# Patient Record
Sex: Female | Born: 1985 | Race: White | Hispanic: No | Smoking: Current every day smoker
Health system: Southern US, Community
[De-identification: ages and names within clinical notes are randomized; demographics above are authoritative.]

## PROBLEM LIST (undated history)

## (undated) ENCOUNTER — Ambulatory Visit: Admission: EM | Payer: BC Managed Care – PPO

## (undated) DIAGNOSIS — M51369 Other intervertebral disc degeneration, lumbar region without mention of lumbar back pain or lower extremity pain: Secondary | ICD-10-CM

## (undated) DIAGNOSIS — F32A Depression, unspecified: Secondary | ICD-10-CM

## (undated) DIAGNOSIS — F329 Major depressive disorder, single episode, unspecified: Secondary | ICD-10-CM

## (undated) DIAGNOSIS — S2232XA Fracture of one rib, left side, initial encounter for closed fracture: Secondary | ICD-10-CM

## (undated) DIAGNOSIS — J45909 Unspecified asthma, uncomplicated: Secondary | ICD-10-CM

## (undated) DIAGNOSIS — M5126 Other intervertebral disc displacement, lumbar region: Secondary | ICD-10-CM

## (undated) DIAGNOSIS — J942 Hemothorax: Secondary | ICD-10-CM

## (undated) DIAGNOSIS — S32302A Unspecified fracture of left ilium, initial encounter for closed fracture: Secondary | ICD-10-CM

## (undated) DIAGNOSIS — S060X9A Concussion with loss of consciousness of unspecified duration, initial encounter: Secondary | ICD-10-CM

## (undated) DIAGNOSIS — I2699 Other pulmonary embolism without acute cor pulmonale: Secondary | ICD-10-CM

## (undated) DIAGNOSIS — F419 Anxiety disorder, unspecified: Secondary | ICD-10-CM

## (undated) DIAGNOSIS — Z720 Tobacco use: Secondary | ICD-10-CM

## (undated) DIAGNOSIS — G4700 Insomnia, unspecified: Secondary | ICD-10-CM

## (undated) DIAGNOSIS — M5136 Other intervertebral disc degeneration, lumbar region: Secondary | ICD-10-CM

## (undated) HISTORY — PX: COLPOSCOPY: SHX161

## (undated) HISTORY — PX: CHEST TUBE INSERTION: SHX231

---

## 2008-02-11 ENCOUNTER — Encounter (INDEPENDENT_AMBULATORY_CARE_PROVIDER_SITE_OTHER): Payer: Self-pay | Admitting: Unknown Physician Specialty

## 2008-02-11 ENCOUNTER — Other Ambulatory Visit: Admission: RE | Admit: 2008-02-11 | Discharge: 2008-02-11 | Payer: Self-pay | Admitting: Unknown Physician Specialty

## 2009-02-03 ENCOUNTER — Emergency Department (HOSPITAL_COMMUNITY): Admission: EM | Admit: 2009-02-03 | Discharge: 2009-02-03 | Payer: Self-pay | Admitting: Emergency Medicine

## 2010-04-10 LAB — CULTURE, ROUTINE-ABSCESS

## 2011-07-15 ENCOUNTER — Emergency Department (HOSPITAL_COMMUNITY)
Admission: EM | Admit: 2011-07-15 | Discharge: 2011-07-15 | Disposition: A | Discharge status: Home / Self Care | Payer: Self-pay | Attending: Emergency Medicine | Admitting: Emergency Medicine

## 2011-07-15 ENCOUNTER — Encounter (HOSPITAL_COMMUNITY): Payer: Self-pay | Admitting: *Deleted

## 2011-07-15 ENCOUNTER — Emergency Department (HOSPITAL_COMMUNITY): Payer: Self-pay

## 2011-07-15 DIAGNOSIS — S61209A Unspecified open wound of unspecified finger without damage to nail, initial encounter: Secondary | ICD-10-CM | POA: Insufficient documentation

## 2011-07-15 DIAGNOSIS — W268XXA Contact with other sharp object(s), not elsewhere classified, initial encounter: Secondary | ICD-10-CM | POA: Insufficient documentation

## 2011-07-15 DIAGNOSIS — S61411A Laceration without foreign body of right hand, initial encounter: Secondary | ICD-10-CM

## 2011-07-15 DIAGNOSIS — F17200 Nicotine dependence, unspecified, uncomplicated: Secondary | ICD-10-CM | POA: Insufficient documentation

## 2011-07-15 MED ORDER — HYDROCODONE-ACETAMINOPHEN 5-325 MG PO TABS: ORAL_TABLET | ORAL | Status: DC

## 2011-07-15 MED ORDER — HYDROCODONE-ACETAMINOPHEN 5-325 MG PO TABS
1.0000 | ORAL_TABLET | Freq: Once | ORAL | Status: AC
  Administered 2011-07-15: 1 via ORAL
  Filled 2011-07-15: qty 1

## 2011-07-15 MED ORDER — PROMETHAZINE HCL 12.5 MG PO TABS
12.5000 mg | ORAL_TABLET | Freq: Once | ORAL | Status: AC
  Administered 2011-07-15: 12.5 mg via ORAL
  Filled 2011-07-15: qty 1

## 2011-07-15 NOTE — ED Notes (Signed)
Applied dressing and wrapped gauze to right hand. Patient tolerated well.

## 2011-07-15 NOTE — Discharge Instructions (Signed)
Please keep in clean and dry. Please have sutures removed in 7 days. See your physician or return to the emergency department if any signs or changes of infection. May gently cleanse wound with soap and water, and change dressing daily.Stitches, Staples, or Skin Adhesive Strips  Stitches (sutures), staples, and skin adhesive strips hold the skin together as it heals. They will usually be in place for 7 days or less. HOME CARE  Wash your hands with soap and water before and after you touch your wound.   Only take medicine as told by your doctor.   Cover your wound only if your doctor told you to. Otherwise, leave it open to air.   Do not get your stitches wet or dirty. If they get dirty, dab them gently with a clean washcloth. Wet the washcloth with soapy water. Do not rub. Pat them dry gently.   Do not put medicine or medicated cream on your stitches unless your doctor told you to.   Do not take out your own stitches or staples. Skin adhesive strips will fall off by themselves.   Do not pick at the wound. Picking can cause an infection.   Do not miss your follow-up appointment.   If you have problems or questions, call your doctor.  GET HELP RIGHT AWAY IF:   You have a temperature by mouth above 102 F (38.9 C), not controlled by medicine.   You have chills.   You have redness or pain around your stitches.   There is puffiness (swelling) around your stitches.   You notice fluid (drainage) from your stitches.   There is a bad smell coming from your wound.  MAKE SURE YOU:  Understand these instructions.   Will watch your condition.   Will get help if you are not doing well or get worse.  Document Released: 11/06/2008 Document Revised: 12/29/2010 Document Reviewed: 11/06/2008 [facility] Patient Information 2012 [place], [place].

## 2011-07-15 NOTE — ED Provider Notes (Signed)
History     CSN: [id number]  Arrival date & time 07/15/11  2138   First MD Initiated Contact with Patient 07/15/11 2221      Chief Complaint  Patient presents with  . Laceration    (Consider location/radiation/quality/duration/timing/severity/associated sxs/prior treatment) HPI Comments: Patient states she was washing dishes tonight when she cut her hand on glass. She denies any other injury. She has had good range of motion of the right fifth finger. Bleeding was controlled by applying pressure. She states her tetanus is up-to-date. She has not taken any medications for the finger pain up to this point.  Patient is a 26 y.o. female presenting with skin laceration. The history is provided by the patient.  Laceration  The incident occurred 1 to 2 hours ago. The laceration is located on the right hand.    History reviewed. No pertinent past medical history.  History reviewed. No pertinent past surgical history.  History reviewed. No pertinent family history.  History  Substance Use Topics  . Smoking status: Current Everyday Smoker  . Smokeless tobacco: Not on file  . Alcohol Use: No    OB History    Grav Para Term Preterm Abortions TAB SAB Ect Mult Living                  Review of Systems  Constitutional: Negative for activity change.       All ROS Neg except as noted in HPI  HENT: Negative for nosebleeds and neck pain.   Eyes: Negative for photophobia and discharge.  Respiratory: Negative for cough, shortness of breath and wheezing.   Cardiovascular: Negative for chest pain and palpitations.  Gastrointestinal: Negative for abdominal pain and blood in stool.  Genitourinary: Negative for dysuria, frequency and hematuria.  Musculoskeletal: Negative for back pain and arthralgias.  Skin: Negative.   Neurological: Negative for dizziness, seizures and speech difficulty.  Psychiatric/Behavioral: Negative for hallucinations and confusion.    Allergies  Review of  patient's allergies indicates no known allergies.  Home Medications  No current outpatient prescriptions on file.  BP 127/88  Pulse 83  Temp 98.2 F (36.8 C)  Resp 20  Ht 5\' 6"  (1.676 m)  Wt 129 lb (58.514 kg)  BMI 20.82 kg/m2  SpO2 100%  Physical Exam  Nursing note and vitals reviewed. Constitutional: She is oriented to person, place, and time. She appears well-developed and well-nourished.  Non-toxic appearance.  HENT:  Head: Normocephalic.  Right Ear: Tympanic membrane and external ear normal.  Left Ear: Tympanic membrane and external ear normal.  Eyes: EOM and lids are normal. Pupils are equal, round, and reactive to light.  Neck: Normal range of motion. Neck supple. Carotid bruit is not present.  Cardiovascular: Normal rate, regular rhythm, normal heart sounds, intact distal pulses and normal pulses.   Pulmonary/Chest: Breath sounds normal. No respiratory distress.  Abdominal: Soft. Bowel sounds are normal. There is no tenderness. There is no guarding.  Musculoskeletal: Normal range of motion.       There is a 2.1 cm laceration to the lateral aspect of the right fifth carpal bone area. No bone or tendon involvement. The patient is able to flex and extend the finger against resistance. There is good capillary refill present.  Lymphadenopathy:       Head (right side): No submandibular adenopathy present.       Head (left side): No submandibular adenopathy present.    She has no cervical adenopathy.  Neurological: She is alert and oriented  to person, place, and time. She has normal strength. No cranial nerve deficit or sensory deficit.  Skin: Skin is warm and dry.  Psychiatric: She has a normal mood and affect. Her speech is normal.    ED Course  Procedures : LACERATION REPAIR - patient identified by arm band. Permission for the procedure was given by the patient. Time out was taken before repairing laceration of the right hand. The wound was painted with Betadine. The wound  was infiltrated with 0.25% bupivacaine. Wound was then irrigated with saline. There was no tendon involvement no bone involvement and no foreign body noted. The wound was then painted with Betadine and using sterile technique the wound was closed with 5 interrupted sutures of 4-0 nylon. The patient tolerated the procedure without problem. Sterile dressing was applied.  Labs Reviewed - No data to display Dg Hand Complete Right  07/15/2011  *RADIOLOGY REPORT*  Clinical Data: Laceration to the right fifth finger from broken glass.  RIGHT HAND - COMPLETE 3+ VIEW  Comparison: None.  Findings: There is no evidence of fracture or dislocation.  No radiopaque foreign bodies are seen.  Mild soft tissue swelling is noted at the site of laceration.  The joint spaces are preserved. The carpal rows are intact, and demonstrate normal alignment.  IMPRESSION: No evidence of fracture or dislocation; no radiopaque foreign bodies seen.  Original Report Authenticated By: Tonia Ghent, M.D.     1. Laceration of hand, right       MDM  I have reviewed nursing notes, vital signs, and all appropriate lab and imaging results for this patient.  Patient sustained laceration to the lateral portion of the right fifth carpal area. There is no bone or tendon involvement. The wound was repaired. The patient is to return for the sutures to be removed in 7 days. Patient advised to return sooner if any signs of infection.      Kathie Dike, [place] 07/15/11 2349

## 2011-07-15 NOTE — ED Notes (Signed)
Pt has laceration to right hand. Pt staes there may be glass still in it. Pt was washing a glass.

## 2011-07-15 NOTE — ED Notes (Signed)
Patient requesting pain medication. Advised PA.

## 2011-07-16 NOTE — ED Provider Notes (Signed)
Medical screening examination/treatment/procedure(s) were performed by non-physician practitioner and as supervising physician I was immediately available for consultation/collaboration.   Joya Gaskins, MD 07/16/11 2052

## 2013-09-17 ENCOUNTER — Emergency Department (HOSPITAL_COMMUNITY): Payer: No Typology Code available for payment source

## 2013-09-17 ENCOUNTER — Encounter (HOSPITAL_COMMUNITY): Payer: Self-pay | Admitting: Emergency Medicine

## 2013-09-17 ENCOUNTER — Inpatient Hospital Stay (HOSPITAL_COMMUNITY)
Admission: EM | Admit: 2013-09-17 | Discharge: 2013-09-23 | DRG: 964 | Disposition: A | Discharge status: Home / Self Care | Payer: No Typology Code available for payment source | Attending: General Surgery | Admitting: General Surgery

## 2013-09-17 DIAGNOSIS — S32302A Unspecified fracture of left ilium, initial encounter for closed fracture: Secondary | ICD-10-CM

## 2013-09-17 DIAGNOSIS — S270XXA Traumatic pneumothorax, initial encounter: Secondary | ICD-10-CM | POA: Diagnosis present

## 2013-09-17 DIAGNOSIS — Z791 Long term (current) use of non-steroidal anti-inflammatories (NSAID): Secondary | ICD-10-CM

## 2013-09-17 DIAGNOSIS — S060XAA Concussion with loss of consciousness status unknown, initial encounter: Secondary | ICD-10-CM | POA: Diagnosis present

## 2013-09-17 DIAGNOSIS — S2232XA Fracture of one rib, left side, initial encounter for closed fracture: Secondary | ICD-10-CM

## 2013-09-17 DIAGNOSIS — S0010XA Contusion of unspecified eyelid and periocular area, initial encounter: Secondary | ICD-10-CM | POA: Diagnosis present

## 2013-09-17 DIAGNOSIS — J45909 Unspecified asthma, uncomplicated: Secondary | ICD-10-CM | POA: Diagnosis present

## 2013-09-17 DIAGNOSIS — S0083XA Contusion of other part of head, initial encounter: Secondary | ICD-10-CM

## 2013-09-17 DIAGNOSIS — S2242XA Multiple fractures of ribs, left side, initial encounter for closed fracture: Secondary | ICD-10-CM | POA: Diagnosis present

## 2013-09-17 DIAGNOSIS — S060X9A Concussion with loss of consciousness of unspecified duration, initial encounter: Secondary | ICD-10-CM | POA: Diagnosis present

## 2013-09-17 DIAGNOSIS — S2249XA Multiple fractures of ribs, unspecified side, initial encounter for closed fracture: Secondary | ICD-10-CM | POA: Diagnosis present

## 2013-09-17 DIAGNOSIS — IMO0002 Reserved for concepts with insufficient information to code with codable children: Secondary | ICD-10-CM

## 2013-09-17 DIAGNOSIS — S272XXA Traumatic hemopneumothorax, initial encounter: Principal | ICD-10-CM | POA: Diagnosis present

## 2013-09-17 DIAGNOSIS — F17200 Nicotine dependence, unspecified, uncomplicated: Secondary | ICD-10-CM | POA: Diagnosis present

## 2013-09-17 DIAGNOSIS — J939 Pneumothorax, unspecified: Secondary | ICD-10-CM

## 2013-09-17 DIAGNOSIS — S32309A Unspecified fracture of unspecified ilium, initial encounter for closed fracture: Secondary | ICD-10-CM | POA: Diagnosis present

## 2013-09-17 DIAGNOSIS — S0081XA Abrasion of other part of head, initial encounter: Secondary | ICD-10-CM | POA: Diagnosis present

## 2013-09-17 DIAGNOSIS — J942 Hemothorax: Secondary | ICD-10-CM

## 2013-09-17 HISTORY — DX: Hemothorax: J94.2

## 2013-09-17 HISTORY — DX: Concussion with loss of consciousness status unknown, initial encounter: S06.0XAA

## 2013-09-17 HISTORY — DX: Concussion with loss of consciousness of unspecified duration, initial encounter: S06.0X9A

## 2013-09-17 HISTORY — DX: Fracture of one rib, left side, initial encounter for closed fracture: S22.32XA

## 2013-09-17 HISTORY — DX: Unspecified fracture of left ilium, initial encounter for closed fracture: S32.302A

## 2013-09-17 LAB — BASIC METABOLIC PANEL
Anion gap: 16 — ABNORMAL HIGH (ref 5–15)
BUN: 14 mg/dL (ref 6–23)
CO2: 20 mEq/L (ref 19–32)
Calcium: 8.8 mg/dL (ref 8.4–10.5)
Chloride: 106 mEq/L (ref 96–112)
Creatinine, Ser: 0.74 mg/dL (ref 0.50–1.10)
GFR calc Af Amer: >90 mL/min (ref >90)
GFR calc non Af Amer: >90 mL/min (ref >90)
Glucose, Bld: 155 mg/dL — ABNORMAL HIGH (ref 70–99)
Potassium: 3.2 mEq/L — ABNORMAL LOW (ref 3.7–5.3)
Sodium: 142 mEq/L (ref 137–147)

## 2013-09-17 LAB — CBC WITH DIFFERENTIAL/PLATELET
Basophils Absolute: 0 10*3/uL (ref 0.0–0.1)
Basophils Relative: 0 % (ref 0–1)
Eosinophils Absolute: 0.6 10*3/uL (ref 0.0–0.7)
Eosinophils Relative: 4 % (ref 0–5)
HCT: 39.1 % (ref 36.0–46.0)
Hemoglobin: 13.4 g/dL (ref 12.0–15.0)
Lymphocytes Relative: 27 % (ref 12–46)
Lymphs Abs: 4.1 10*3/uL — ABNORMAL HIGH (ref 0.7–4.0)
MCH: 30.9 pg (ref 26.0–34.0)
MCHC: 34.3 g/dL (ref 30.0–36.0)
MCV: 90.1 fL (ref 78.0–100.0)
Monocytes Absolute: 0.7 10*3/uL (ref 0.1–1.0)
Monocytes Relative: 4 % (ref 3–12)
Neutro Abs: 9.8 10*3/uL — ABNORMAL HIGH (ref 1.7–7.7)
Neutrophils Relative %: 65 % (ref 43–77)
Platelets: 207 10*3/uL (ref 150–400)
RBC: 4.34 MIL/uL (ref 3.87–5.11)
RDW: 13.9 % (ref 11.5–15.5)
WBC: 15.2 10*3/uL — ABNORMAL HIGH (ref 4.0–10.5)

## 2013-09-17 LAB — HEPATIC FUNCTION PANEL
ALK PHOS: 47 U/L (ref 39–117)
ALT: 13 U/L (ref 0–35)
AST: 33 U/L (ref 0–37)
Albumin: 3.4 g/dL — ABNORMAL LOW (ref 3.5–5.2)
BILIRUBIN TOTAL: 0.3 mg/dL (ref 0.3–1.2)
Total Protein: 6.3 g/dL (ref 6.0–8.3)

## 2013-09-17 LAB — TYPE AND SCREEN
ABO/RH(D): O NEG
Antibody Screen: NEGATIVE

## 2013-09-17 LAB — LACTIC ACID, PLASMA: Lactic Acid, Venous: 2.9 mmol/L — ABNORMAL HIGH (ref 0.5–2.2)

## 2013-09-17 LAB — PREGNANCY, URINE: Preg Test, Ur: NEGATIVE

## 2013-09-17 LAB — LIPASE, BLOOD: Lipase: 24 U/L (ref 11–59)

## 2013-09-17 MED ORDER — NICOTINE 21 MG/24HR TD PT24
21.0000 mg | MEDICATED_PATCH | Freq: Every day | TRANSDERMAL | Status: DC
  Administered 2013-09-17 – 2013-09-23 (×7): 21 mg via TRANSDERMAL
  Filled 2013-09-17 (×9): qty 1

## 2013-09-17 MED ORDER — ACETAMINOPHEN 325 MG PO TABS
650.0000 mg | ORAL_TABLET | Freq: Four times a day (QID) | ORAL | Status: AC
  Administered 2013-09-17 – 2013-09-19 (×6): 650 mg via ORAL
  Filled 2013-09-17 (×7): qty 2

## 2013-09-17 MED ORDER — FENTANYL CITRATE 0.05 MG/ML IJ SOLN
50.0000 ug | Freq: Once | INTRAMUSCULAR | Status: AC
  Administered 2013-09-17: 50 ug via INTRAVENOUS
  Filled 2013-09-17: qty 2

## 2013-09-17 MED ORDER — METHOCARBAMOL 1000 MG/10ML IJ SOLN
500.0000 mg | Freq: Three times a day (TID) | INTRAVENOUS | Status: DC
  Administered 2013-09-17 – 2013-09-18 (×2): 500 mg via INTRAVENOUS
  Filled 2013-09-17 (×5): qty 5

## 2013-09-17 MED ORDER — HYDROMORPHONE HCL PF 1 MG/ML IJ SOLN
0.7500 mg | Freq: Once | INTRAMUSCULAR | Status: AC
  Administered 2013-09-17: 0.75 mg via INTRAVENOUS
  Filled 2013-09-17: qty 1

## 2013-09-17 MED ORDER — ALBUTEROL SULFATE (2.5 MG/3ML) 0.083% IN NEBU
3.0000 mL | INHALATION_SOLUTION | Freq: Four times a day (QID) | RESPIRATORY_TRACT | Status: DC
  Administered 2013-09-17 – 2013-09-19 (×7): 3 mL via RESPIRATORY_TRACT
  Filled 2013-09-17 (×7): qty 3

## 2013-09-17 MED ORDER — ALPRAZOLAM 0.5 MG PO TABS
1.0000 mg | ORAL_TABLET | Freq: Every evening | ORAL | Status: DC | PRN
  Administered 2013-09-17 – 2013-09-22 (×6): 1 mg via ORAL
  Filled 2013-09-17 (×6): qty 2

## 2013-09-17 MED ORDER — OXYCODONE HCL 5 MG PO TABS: 2.5000 mg | ORAL_TABLET | ORAL | Status: DC | PRN

## 2013-09-17 MED ORDER — POVIDONE-IODINE 10 % EX SOLN
CUTANEOUS | Status: AC
  Administered 2013-09-17: 1
  Filled 2013-09-17: qty 118

## 2013-09-17 MED ORDER — ONDANSETRON HCL 4 MG/2ML IJ SOLN: 4.0000 mg | Freq: Four times a day (QID) | INTRAMUSCULAR | Status: DC | PRN

## 2013-09-17 MED ORDER — LIDOCAINE HCL (PF) 1 % IJ SOLN
INTRAMUSCULAR | Status: AC
  Filled 2013-09-17: qty 5

## 2013-09-17 MED ORDER — FENTANYL CITRATE 0.05 MG/ML IJ SOLN
50.0000 ug | Freq: Once | INTRAMUSCULAR | Status: AC
  Administered 2013-09-17: 100 ug via INTRAVENOUS
  Administered 2013-09-17: 50 ug via INTRAVENOUS

## 2013-09-17 MED ORDER — DOCUSATE SODIUM 100 MG PO CAPS
100.0000 mg | ORAL_CAPSULE | Freq: Two times a day (BID) | ORAL | Status: DC
  Administered 2013-09-17 – 2013-09-23 (×12): 100 mg via ORAL
  Filled 2013-09-17 (×11): qty 1

## 2013-09-17 MED ORDER — SODIUM CHLORIDE 0.9 % IV BOLUS (SEPSIS)
1000.0000 mL | Freq: Once | INTRAVENOUS | Status: AC
  Administered 2013-09-17: 1000 mL via INTRAVENOUS

## 2013-09-17 MED ORDER — ONDANSETRON HCL 4 MG/2ML IJ SOLN
4.0000 mg | Freq: Once | INTRAMUSCULAR | Status: AC
  Administered 2013-09-17: 4 mg via INTRAMUSCULAR
  Filled 2013-09-17: qty 2

## 2013-09-17 MED ORDER — FENTANYL CITRATE 0.05 MG/ML IJ SOLN
INTRAMUSCULAR | Status: AC
  Administered 2013-09-17: 100 ug via INTRAVENOUS
  Filled 2013-09-17: qty 2

## 2013-09-17 MED ORDER — ENOXAPARIN SODIUM 40 MG/0.4ML ~~LOC~~ SOLN
40.0000 mg | SUBCUTANEOUS | Status: DC
  Administered 2013-09-17 – 2013-09-22 (×6): 40 mg via SUBCUTANEOUS
  Filled 2013-09-17 (×11): qty 0.4

## 2013-09-17 MED ORDER — MORPHINE SULFATE 2 MG/ML IJ SOLN
1.0000 mg | INTRAMUSCULAR | Status: DC | PRN
  Administered 2013-09-17 – 2013-09-18 (×5): 2 mg via INTRAVENOUS
  Filled 2013-09-17 (×6): qty 1

## 2013-09-17 MED ORDER — ONDANSETRON HCL 4 MG PO TABS: 4.0000 mg | ORAL_TABLET | Freq: Four times a day (QID) | ORAL | Status: DC | PRN

## 2013-09-17 MED ORDER — PANTOPRAZOLE SODIUM 40 MG IV SOLR
40.0000 mg | Freq: Every day | INTRAVENOUS | Status: DC
  Filled 2013-09-17: qty 40

## 2013-09-17 MED ORDER — OXYCODONE HCL 5 MG PO TABS
10.0000 mg | ORAL_TABLET | ORAL | Status: DC | PRN
  Administered 2013-09-18 (×2): 10 mg via ORAL
  Filled 2013-09-17 (×2): qty 2

## 2013-09-17 MED ORDER — FENTANYL CITRATE 0.05 MG/ML IJ SOLN
100.0000 ug | Freq: Once | INTRAMUSCULAR | Status: AC
  Administered 2013-09-17: 100 ug via INTRAVENOUS

## 2013-09-17 MED ORDER — FENTANYL CITRATE 0.05 MG/ML IJ SOLN
INTRAMUSCULAR | Status: AC
  Filled 2013-09-17: qty 2

## 2013-09-17 MED ORDER — IOHEXOL 300 MG/ML  SOLN
100.0000 mL | Freq: Once | INTRAMUSCULAR | Status: AC | PRN
  Administered 2013-09-17: 100 mL via INTRAVENOUS

## 2013-09-17 MED ORDER — LIDOCAINE HCL (PF) 1 % IJ SOLN
30.0000 mL | Freq: Once | INTRAMUSCULAR | Status: AC
  Administered 2013-09-17: 30 mL

## 2013-09-17 MED ORDER — PANTOPRAZOLE SODIUM 40 MG PO TBEC
40.0000 mg | DELAYED_RELEASE_TABLET | Freq: Every day | ORAL | Status: DC
  Administered 2013-09-17: 40 mg via ORAL
  Filled 2013-09-17: qty 1

## 2013-09-17 MED ORDER — TETANUS-DIPHTH-ACELL PERTUSSIS 5-2.5-18.5 LF-MCG/0.5 IM SUSP
0.5000 mL | Freq: Once | INTRAMUSCULAR | Status: AC
  Administered 2013-09-17: 0.5 mL via INTRAMUSCULAR
  Filled 2013-09-17: qty 0.5

## 2013-09-17 MED ORDER — OXYCODONE HCL 5 MG PO TABS: 5.0000 mg | ORAL_TABLET | ORAL | Status: DC | PRN

## 2013-09-17 MED ORDER — KCL IN DEXTROSE-NACL 20-5-0.45 MEQ/L-%-% IV SOLN
INTRAVENOUS | Status: DC
  Administered 2013-09-17 – 2013-09-18 (×2): via INTRAVENOUS
  Filled 2013-09-17 (×4): qty 1000

## 2013-09-17 NOTE — ED Notes (Signed)
Informed RN on floor on morphine administration

## 2013-09-17 NOTE — ED Notes (Signed)
Report given to French Ana, RN charge in [place].

## 2013-09-17 NOTE — ED Notes (Signed)
Pt hollaring and screaming while chest tube attempted. vo fentanyl to be given by dr Juleen China, read back and verfied .

## 2013-09-17 NOTE — ED Notes (Signed)
Warm washcloth applied to L orbital area per pt request

## 2013-09-17 NOTE — ED Notes (Addendum)
Dr Wilson at bedside.

## 2013-09-17 NOTE — ED Notes (Signed)
c-collar removed by MD; pt sitting in bed; reporting lower back pain

## 2013-09-17 NOTE — ED Notes (Signed)
Swelling noted to L periorbital area; abrasion noted to L shoulder, and bilateral hips. Pt reports initial abdominal pain that was relieved with catheter insertion.

## 2013-09-17 NOTE — ED Notes (Signed)
Chest tube placement started at 1535 and ended at 1555.
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
## 2013-09-17 NOTE — ED Notes (Signed)
Pt left ED with [facility]

## 2013-09-17 NOTE — ED Notes (Signed)
Dr Hewitt at bedside.  
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
## 2013-09-17 NOTE — ED Notes (Signed)
Ortho MD at bedside.

## 2013-09-17 NOTE — ED Notes (Signed)
0.25mg  of Dilaudid given per verbal order by Dr.Kohut, prior to pt departure to [facility]
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
## 2013-09-18 ENCOUNTER — Inpatient Hospital Stay (HOSPITAL_COMMUNITY): Payer: No Typology Code available for payment source

## 2013-09-18 DIAGNOSIS — S060XAA Concussion with loss of consciousness status unknown, initial encounter: Secondary | ICD-10-CM | POA: Diagnosis present

## 2013-09-18 DIAGNOSIS — S0081XA Abrasion of other part of head, initial encounter: Secondary | ICD-10-CM | POA: Diagnosis present

## 2013-09-18 DIAGNOSIS — S2242XA Multiple fractures of ribs, left side, initial encounter for closed fracture: Secondary | ICD-10-CM | POA: Diagnosis present

## 2013-09-18 DIAGNOSIS — S0083XA Contusion of other part of head, initial encounter: Secondary | ICD-10-CM | POA: Diagnosis present

## 2013-09-18 DIAGNOSIS — S060X9A Concussion with loss of consciousness of unspecified duration, initial encounter: Secondary | ICD-10-CM | POA: Diagnosis present

## 2013-09-18 DIAGNOSIS — S32302A Unspecified fracture of left ilium, initial encounter for closed fracture: Secondary | ICD-10-CM | POA: Diagnosis present

## 2013-09-18 DIAGNOSIS — S270XXA Traumatic pneumothorax, initial encounter: Secondary | ICD-10-CM | POA: Diagnosis present

## 2013-09-18 LAB — CBC
HEMATOCRIT: 35.1 % — AB (ref 36.0–46.0)
HEMOGLOBIN: 12 g/dL (ref 12.0–15.0)
MCH: 31 pg (ref 26.0–34.0)
MCHC: 34.2 g/dL (ref 30.0–36.0)
MCV: 90.7 fL (ref 78.0–100.0)
Platelets: 148 10*3/uL — ABNORMAL LOW (ref 150–400)
RBC: 3.87 MIL/uL (ref 3.87–5.11)
RDW: 14.3 % (ref 11.5–15.5)
WBC: 7.8 10*3/uL (ref 4.0–10.5)

## 2013-09-18 LAB — BASIC METABOLIC PANEL
ANION GAP: 11 (ref 5–15)
BUN: 7 mg/dL (ref 6–23)
CALCIUM: 8.7 mg/dL (ref 8.4–10.5)
CO2: 22 mEq/L (ref 19–32)
CREATININE: 0.59 mg/dL (ref 0.50–1.10)
Chloride: 107 mEq/L (ref 96–112)
GFR calc Af Amer: >90 mL/min (ref >90)
GFR calc non Af Amer: >90 mL/min (ref >90)
GLUCOSE: 133 mg/dL — AB (ref 70–99)
Potassium: 4 mEq/L (ref 3.7–5.3)
Sodium: 140 mEq/L (ref 137–147)

## 2013-09-18 MED ORDER — NAPROXEN 500 MG PO TABS
500.0000 mg | ORAL_TABLET | Freq: Two times a day (BID) | ORAL | Status: DC
Start: 2013-09-18 — End: 2013-09-23
  Administered 2013-09-18 – 2013-09-23 (×11): 500 mg via ORAL
  Filled 2013-09-18 (×2): qty 1
  Filled 2013-09-18 (×2): qty 2
  Filled 2013-09-18: qty 1
  Filled 2013-09-18: qty 2
  Filled 2013-09-18 (×2): qty 1
  Filled 2013-09-18: qty 2
  Filled 2013-09-18 (×2): qty 1
  Filled 2013-09-18: qty 2
  Filled 2013-09-18 (×6): qty 1
  Filled 2013-09-18: qty 2

## 2013-09-18 MED ORDER — POLYETHYLENE GLYCOL 3350 17 G PO PACK
17.0000 g | PACK | Freq: Every day | ORAL | Status: DC
  Administered 2013-09-18 – 2013-09-23 (×6): 17 g via ORAL
  Filled 2013-09-18 (×8): qty 1

## 2013-09-18 MED ORDER — METHOCARBAMOL 750 MG PO TABS
1500.0000 mg | ORAL_TABLET | Freq: Four times a day (QID) | ORAL | Status: DC
  Administered 2013-09-18 – 2013-09-21 (×16): 1500 mg via ORAL
  Filled 2013-09-18 (×21): qty 2

## 2013-09-18 MED ORDER — MORPHINE SULFATE 2 MG/ML IJ SOLN
2.0000 mg | INTRAMUSCULAR | Status: DC | PRN
  Administered 2013-09-18 – 2013-09-20 (×9): 2 mg via INTRAVENOUS
  Filled 2013-09-18 (×9): qty 1

## 2013-09-18 MED ORDER — OXYCODONE HCL 5 MG PO TABS
5.0000 mg | ORAL_TABLET | ORAL | Status: DC | PRN
  Administered 2013-09-18 – 2013-09-19 (×5): 15 mg via ORAL
  Filled 2013-09-18 (×5): qty 3

## 2013-09-18 NOTE — Progress Notes (Signed)
Subjective: [name] states that she is feeling much better today compared to yesterday. Her only complaint at this time is a mild increase in SOB, which she attributes to just getting up with PT. She was able to ambulate with PT today, and states that her hip felt fine with walking. She denies any worsening or new pains into her hip or elsewhere. She denies any new HA, change in vision, CP, N/V, fever, chills, paresthesias, or muscular weakness.    Objective: Vital signs in last 24 hours: Temp:  [97.8 F (36.6 C)-99.1 F (37.3 C)] 97.8 F (36.6 C) (08/27 1308) Pulse Rate:  [48-84] 84 (08/27 1506) Resp:  [14-27] 22 (08/27 1506) BP: (93-145)/(51-91) 117/65 mmHg (08/27 1308) SpO2:  [92 %-100 %] 94 % (08/27 1506)  Intake/Output from previous day: 08/26 0701 - 08/27 0700 In: 551.7 [I.V.:496.7; IV Piggyback:55] Out: 2325 [Urine:2325] Intake/Output this shift: Total I/O In: -  Out: 1150 [Urine:1150]   Recent Labs  09/17/13 1352 09/18/13 0545  HGB 13.4 12.0    Recent Labs  09/17/13 1352 09/18/13 0545  WBC 15.2* 7.8  RBC 4.34 3.87  HCT 39.1 35.1*  PLT 207 148*    Recent Labs  09/17/13 1352 09/18/13 0545  NA 142 140  K 3.2* 4.0  CL 106 107  CO2 20 22  BUN 14 7  CREATININE 0.74 0.59  GLUCOSE 155* 133*  CALCIUM 8.8 8.7   No results found for this basename: LABPT, INR,  in the last 72 hours  PE: WD/WN caucasian female in mild distress, sitting upright in her chair. A and O x4. Mood and affect appropriate. EOMI. Swelling and bruising with a few abrasions noted on L eye; improved swelling compared to yesterday. Respirations mildly labored with 3L [place] in place. L CT in place with serosanguinous drainage in the tube. Mild tenderness to palpation over L ala, no crepitus felt. No obvious deformity or malalignment of the lower extremities noted. NV intact with 2+ distal pulses bilaterally. 5/5 strength of knee extensors and flexors.   Assessment/Plan: -L iliac wing fracture:  non-displaced; evident on CT.   WBAT, up with PT  PO narcotics for pain management     [name] [name] 09/18/2013, 3:28 PM
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
## 2013-09-18 NOTE — Progress Notes (Signed)
Breath sounds are diminished on the left.  No air leak.  Will DC Foley.  Therapies have been ordered.  This patient has been seen and I agree with the findings and treatment plan.  Marta Lamas. Gae Bon, MD, FACS [phone] (pager) [phone] (direct pager) Trauma Surgeon

## 2013-09-18 NOTE — Progress Notes (Signed)
UR completed.  Ladelle Teodoro, RN BSN MHA CCM Trauma/Neuro ICU Case Manager [phone]  

## 2013-09-18 NOTE — Progress Notes (Signed)
No air leak, but will keep on suction for today.  Breath sounds are decreased on the left.  This patient has been seen and I agree with the findings and treatment plan.  Marta Lamas. Gae Bon, MD, FACS [phone] (pager) [phone] (direct pager) Trauma Surgeon

## 2013-09-19 ENCOUNTER — Inpatient Hospital Stay (HOSPITAL_COMMUNITY): Payer: No Typology Code available for payment source

## 2013-09-19 MED ORDER — TRAMADOL HCL 50 MG PO TABS
100.0000 mg | ORAL_TABLET | Freq: Four times a day (QID) | ORAL | Status: DC
  Administered 2013-09-19 – 2013-09-23 (×16): 100 mg via ORAL
  Filled 2013-09-19 (×17): qty 2

## 2013-09-19 MED ORDER — ALBUTEROL SULFATE (2.5 MG/3ML) 0.083% IN NEBU
3.0000 mL | INHALATION_SOLUTION | Freq: Four times a day (QID) | RESPIRATORY_TRACT | Status: DC | PRN
Start: 2013-09-19 — End: 2013-09-23
  Administered 2013-09-19 – 2013-09-23 (×9): 3 mL via RESPIRATORY_TRACT
  Filled 2013-09-19 (×9): qty 3

## 2013-09-19 MED ORDER — OXYCODONE HCL 5 MG PO TABS
10.0000 mg | ORAL_TABLET | ORAL | Status: DC | PRN
  Administered 2013-09-19 (×2): 20 mg via ORAL
  Administered 2013-09-19: 15 mg via ORAL
  Administered 2013-09-20 – 2013-09-23 (×13): 20 mg via ORAL
  Filled 2013-09-19 (×9): qty 4
  Filled 2013-09-19: qty 3
  Filled 2013-09-19 (×6): qty 4
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
## 2013-09-19 NOTE — Progress Notes (Signed)
Patient seen and examined.  Agree with PA's note.  

## 2013-09-20 ENCOUNTER — Inpatient Hospital Stay (HOSPITAL_COMMUNITY): Payer: No Typology Code available for payment source

## 2013-09-20 MED ORDER — HYDROMORPHONE HCL PF 1 MG/ML IJ SOLN
INTRAMUSCULAR | Status: AC
  Filled 2013-09-20: qty 1

## 2013-09-20 MED ORDER — HYDROMORPHONE HCL PF 1 MG/ML IJ SOLN
0.5000 mg | INTRAMUSCULAR | Status: DC | PRN
  Administered 2013-09-20: 10:00:00 via INTRAVENOUS
  Administered 2013-09-21 – 2013-09-23 (×2): 0.5 mg via INTRAVENOUS
  Filled 2013-09-20 (×2): qty 1

## 2013-09-20 MED ORDER — MORPHINE SULFATE ER 15 MG PO TBCR
15.0000 mg | EXTENDED_RELEASE_TABLET | Freq: Two times a day (BID) | ORAL | Status: DC
  Administered 2013-09-20: 15 mg via ORAL
  Filled 2013-09-20 (×2): qty 1
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
## 2013-09-20 NOTE — Progress Notes (Addendum)
OT Cancellation Note  Patient Details Name: [name] [name] MRN: [id number] DOB: Jun 20, 1985   Cancelled Treatment:    Reason Eval/Treat Not Completed: Pain limiting ability to participate, PA present and had added suction  and states he wants it to remain on all day.  Pt. C/o increased pain and wanted to remain "still in the bed". Informed we will attempt back tomorrow or first of the week.    Robet Leu, COTA/L 09/20/2013, 10:00 AM

## 2013-09-21 ENCOUNTER — Inpatient Hospital Stay (HOSPITAL_COMMUNITY): Payer: No Typology Code available for payment source

## 2013-09-21 MED ORDER — ALPRAZOLAM 0.5 MG PO TABS: 1.0000 mg | ORAL_TABLET | Freq: Three times a day (TID) | ORAL | Status: DC | PRN

## 2013-09-21 MED ORDER — ALPRAZOLAM 0.25 MG PO TABS
0.2500 mg | ORAL_TABLET | Freq: Three times a day (TID) | ORAL | Status: DC | PRN
Start: 2013-09-21 — End: 2013-09-23
  Administered 2013-09-21 – 2013-09-23 (×3): 0.25 mg via ORAL
  Filled 2013-09-21 (×4): qty 1

## 2013-09-21 MED ORDER — MORPHINE SULFATE ER 30 MG PO TBCR
30.0000 mg | EXTENDED_RELEASE_TABLET | Freq: Two times a day (BID) | ORAL | Status: DC
  Administered 2013-09-21 – 2013-09-23 (×5): 30 mg via ORAL
  Filled 2013-09-21 (×2): qty 1
  Filled 2013-09-21: qty 2
  Filled 2013-09-21: qty 1
  Filled 2013-09-21: qty 2

## 2013-09-21 NOTE — Progress Notes (Signed)
I have seen and examined the pt and agree with PA-Jeffery's progress note.  
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
## 2013-09-22 ENCOUNTER — Inpatient Hospital Stay (HOSPITAL_COMMUNITY): Payer: No Typology Code available for payment source

## 2013-09-22 MED ORDER — METHOCARBAMOL 500 MG PO TABS
500.0000 mg | ORAL_TABLET | Freq: Four times a day (QID) | ORAL | Status: DC | PRN
  Administered 2013-09-22: 500 mg via ORAL
  Administered 2013-09-23: 1000 mg via ORAL
  Filled 2013-09-22: qty 1
  Filled 2013-09-22: qty 2
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
## 2013-09-22 NOTE — Progress Notes (Signed)
PT Cancellation Note  Patient Details Name: [name] [name] MRN: [id number] DOB: Jul 03, 1985   Cancelled Treatment:    Reason Eval/Treat Not Completed: Other (comment) (already walking around independently, heading outside.) 09/22/2013  [place] Bing, PT [phone]  (pager)  Mariame Rybolt, Eliseo Gum 09/22/2013, 4:47 PM

## 2013-09-23 ENCOUNTER — Inpatient Hospital Stay (HOSPITAL_COMMUNITY): Payer: No Typology Code available for payment source

## 2013-09-23 MED ORDER — DSS 100 MG PO CAPS: 100.0000 mg | ORAL_CAPSULE | Freq: Two times a day (BID) | ORAL | Status: DC

## 2013-09-23 MED ORDER — OXYCODONE HCL 20 MG PO TABS: 10.0000 mg | ORAL_TABLET | ORAL | Status: DC | PRN

## 2013-09-23 MED ORDER — NAPROXEN 500 MG PO TABS: 500.0000 mg | ORAL_TABLET | Freq: Two times a day (BID) | ORAL | Status: DC

## 2013-09-23 MED ORDER — METHOCARBAMOL 500 MG PO TABS: 500.0000 mg | ORAL_TABLET | Freq: Four times a day (QID) | ORAL | Status: DC | PRN

## 2013-09-23 MED ORDER — TRAMADOL HCL 50 MG PO TABS: 100.0000 mg | ORAL_TABLET | Freq: Four times a day (QID) | ORAL | Status: DC

## 2013-09-23 MED ORDER — ALBUTEROL SULFATE HFA 108 (90 BASE) MCG/ACT IN AERS: 2.0000 | INHALATION_SPRAY | Freq: Four times a day (QID) | RESPIRATORY_TRACT | Status: DC | PRN

## 2013-09-23 MED ORDER — POLYETHYLENE GLYCOL 3350 17 G PO PACK: 17.0000 g | PACK | Freq: Every day | ORAL | Status: DC
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
## 2013-09-23 NOTE — Progress Notes (Signed)
Patient ID: [name] [name], female   DOB: [birth year], 28 y.o.   MRN: [id number]  Chest tube removed.  Pt tolerated well.  Will check CXR at 12:30 and DC if stable.  Korianna Washer, ANP-BC

## 2013-09-23 NOTE — Discharge Summary (Signed)
Pamalee Marcoe, MD, MPH, FACS Trauma: [phone] General Surgery: [phone]  

## 2013-09-23 NOTE — Progress Notes (Signed)
Premedicate and D/C CT. Check CXR this PM. Patient examined and I agree with the assessment and plan  Violeta Gelinas, MD, MPH, FACS Trauma: [phone] General Surgery: [phone]  09/23/2013 9:13 AM

## 2013-09-23 NOTE — Progress Notes (Signed)
UR completed. No HH/DME needs anticipated.   Carlyle Lipa, RN BSN MHA CCM Trauma/Neuro ICU Case Manager [phone]

## 2013-10-02 ENCOUNTER — Telehealth (HOSPITAL_COMMUNITY): Payer: Self-pay

## 2013-10-03 NOTE — Telephone Encounter (Signed)
LM explaining we only needed to see her as needed and to call back if she had specific concerns.

## 2013-10-06 ENCOUNTER — Telehealth (HOSPITAL_COMMUNITY): Payer: Self-pay

## 2013-10-06 NOTE — Telephone Encounter (Signed)
Patient having problems with chest tube site and generalized chest pain. Told to come in this Wednesday.

## 2013-10-08 ENCOUNTER — Encounter (HOSPITAL_COMMUNITY): Payer: Self-pay | Admitting: General Surgery

## 2013-10-08 ENCOUNTER — Other Ambulatory Visit (INDEPENDENT_AMBULATORY_CARE_PROVIDER_SITE_OTHER): Payer: Self-pay | Admitting: General Surgery

## 2013-10-08 ENCOUNTER — Inpatient Hospital Stay (HOSPITAL_COMMUNITY)
Admission: AD | Admit: 2013-10-08 | Discharge: 2013-10-10 | DRG: 176 | Disposition: A | Discharge status: Home / Self Care | Payer: No Typology Code available for payment source | Source: Ambulatory Visit | Attending: General Surgery | Admitting: General Surgery

## 2013-10-08 DIAGNOSIS — S060XAA Concussion with loss of consciousness status unknown, initial encounter: Secondary | ICD-10-CM | POA: Diagnosis present

## 2013-10-08 DIAGNOSIS — J45909 Unspecified asthma, uncomplicated: Secondary | ICD-10-CM | POA: Diagnosis present

## 2013-10-08 DIAGNOSIS — Z79899 Other long term (current) drug therapy: Secondary | ICD-10-CM

## 2013-10-08 DIAGNOSIS — S32302A Unspecified fracture of left ilium, initial encounter for closed fracture: Secondary | ICD-10-CM | POA: Diagnosis present

## 2013-10-08 DIAGNOSIS — F17200 Nicotine dependence, unspecified, uncomplicated: Secondary | ICD-10-CM | POA: Diagnosis present

## 2013-10-08 DIAGNOSIS — S060X9A Concussion with loss of consciousness of unspecified duration, initial encounter: Secondary | ICD-10-CM | POA: Diagnosis present

## 2013-10-08 DIAGNOSIS — I2699 Other pulmonary embolism without acute cor pulmonale: Principal | ICD-10-CM | POA: Diagnosis present

## 2013-10-08 DIAGNOSIS — S2242XA Multiple fractures of ribs, left side, initial encounter for closed fracture: Secondary | ICD-10-CM | POA: Diagnosis present

## 2013-10-08 HISTORY — DX: Major depressive disorder, single episode, unspecified: F32.9

## 2013-10-08 HISTORY — DX: Fracture of one rib, left side, initial encounter for closed fracture: S22.32XA

## 2013-10-08 HISTORY — DX: Other intervertebral disc degeneration, lumbar region: M51.36

## 2013-10-08 HISTORY — DX: Unspecified asthma, uncomplicated: J45.909

## 2013-10-08 HISTORY — DX: Other intervertebral disc degeneration, lumbar region without mention of lumbar back pain or lower extremity pain: M51.369

## 2013-10-08 HISTORY — DX: Depression, unspecified: F32.A

## 2013-10-08 HISTORY — DX: Anxiety disorder, unspecified: F41.9

## 2013-10-08 HISTORY — DX: Concussion with loss of consciousness of unspecified duration, initial encounter: S06.0X9A

## 2013-10-08 HISTORY — DX: Other intervertebral disc displacement, lumbar region: M51.26

## 2013-10-08 HISTORY — DX: Insomnia, unspecified: G47.00

## 2013-10-08 HISTORY — DX: Unspecified fracture of left ilium, initial encounter for closed fracture: S32.302A

## 2013-10-08 HISTORY — DX: Tobacco use: Z72.0

## 2013-10-08 HISTORY — DX: Hemothorax: J94.2

## 2013-10-08 LAB — CBC
HCT: 33.2 % — ABNORMAL LOW (ref 36.0–46.0)
HEMOGLOBIN: 10.9 g/dL — AB (ref 12.0–15.0)
MCH: 29.5 pg (ref 26.0–34.0)
MCHC: 32.8 g/dL (ref 30.0–36.0)
MCV: 90 fL (ref 78.0–100.0)
Platelets: 306 10*3/uL (ref 150–400)
RBC: 3.69 MIL/uL — ABNORMAL LOW (ref 3.87–5.11)
RDW: 13.1 % (ref 11.5–15.5)
WBC: 7.5 10*3/uL (ref 4.0–10.5)

## 2013-10-08 LAB — COMPREHENSIVE METABOLIC PANEL
ALT: 9 U/L (ref 0–35)
AST: 18 U/L (ref 0–37)
Albumin: 3 g/dL — ABNORMAL LOW (ref 3.5–5.2)
Alkaline Phosphatase: 72 U/L (ref 39–117)
Anion gap: 11 (ref 5–15)
BUN: 7 mg/dL (ref 6–23)
CO2: 27 mEq/L (ref 19–32)
Calcium: 9.2 mg/dL (ref 8.4–10.5)
Chloride: 97 mEq/L (ref 96–112)
Creatinine, Ser: 0.66 mg/dL (ref 0.50–1.10)
GFR calc non Af Amer: >90 mL/min (ref >90)
GLUCOSE: 78 mg/dL (ref 70–99)
Potassium: 4.3 mEq/L (ref 3.7–5.3)
Sodium: 135 mEq/L — ABNORMAL LOW (ref 137–147)
Total Bilirubin: 0.4 mg/dL (ref 0.3–1.2)
Total Protein: 8.1 g/dL (ref 6.0–8.3)

## 2013-10-08 MED ORDER — NAPROXEN 500 MG PO TABS
500.0000 mg | ORAL_TABLET | Freq: Two times a day (BID) | ORAL | Status: DC
Start: 2013-10-08 — End: 2013-10-10
  Administered 2013-10-08 – 2013-10-10 (×4): 500 mg via ORAL
  Filled 2013-10-08 (×2): qty 1
  Filled 2013-10-08: qty 2
  Filled 2013-10-08 (×4): qty 1

## 2013-10-08 MED ORDER — POTASSIUM CHLORIDE IN NACL 20-0.9 MEQ/L-% IV SOLN
INTRAVENOUS | Status: DC
  Administered 2013-10-08 – 2013-10-09 (×2): via INTRAVENOUS
  Filled 2013-10-08 (×3): qty 1000

## 2013-10-08 MED ORDER — IOHEXOL 300 MG/ML  SOLN
25.0000 mL | INTRAMUSCULAR | Status: AC
  Administered 2013-10-08 (×2): 25 mL via ORAL

## 2013-10-08 MED ORDER — TRAMADOL HCL 50 MG PO TABS
100.0000 mg | ORAL_TABLET | Freq: Four times a day (QID) | ORAL | Status: DC
  Administered 2013-10-08 (×2): 100 mg via ORAL
  Filled 2013-10-08 (×3): qty 2

## 2013-10-08 MED ORDER — MOMETASONE FURO-FORMOTEROL FUM 200-5 MCG/ACT IN AERO
2.0000 | INHALATION_SPRAY | Freq: Two times a day (BID) | RESPIRATORY_TRACT | Status: DC
Start: 2013-10-08 — End: 2013-10-10
  Administered 2013-10-08 – 2013-10-09 (×3): 2 via RESPIRATORY_TRACT
  Filled 2013-10-08: qty 8.8

## 2013-10-08 MED ORDER — POLYETHYLENE GLYCOL 3350 17 G PO PACK
17.0000 g | PACK | Freq: Every day | ORAL | Status: DC
  Filled 2013-10-08 (×3): qty 1

## 2013-10-08 MED ORDER — MORPHINE SULFATE 2 MG/ML IJ SOLN: 1.0000 mg | INTRAMUSCULAR | Status: DC | PRN

## 2013-10-08 MED ORDER — ONDANSETRON HCL 4 MG PO TABS: 4.0000 mg | ORAL_TABLET | Freq: Four times a day (QID) | ORAL | Status: DC | PRN

## 2013-10-08 MED ORDER — METHOCARBAMOL 500 MG PO TABS
500.0000 mg | ORAL_TABLET | Freq: Four times a day (QID) | ORAL | Status: DC | PRN
  Administered 2013-10-08 – 2013-10-09 (×2): 500 mg via ORAL
  Filled 2013-10-08 (×2): qty 1

## 2013-10-08 MED ORDER — ONDANSETRON HCL 4 MG/2ML IJ SOLN
4.0000 mg | Freq: Four times a day (QID) | INTRAMUSCULAR | Status: DC | PRN
  Administered 2013-10-09: 4 mg via INTRAVENOUS
  Filled 2013-10-08: qty 2

## 2013-10-08 MED ORDER — DOCUSATE SODIUM 100 MG PO CAPS
100.0000 mg | ORAL_CAPSULE | Freq: Two times a day (BID) | ORAL | Status: DC
Start: 2013-10-08 — End: 2013-10-10
  Administered 2013-10-08 – 2013-10-10 (×4): 100 mg via ORAL
  Filled 2013-10-08 (×4): qty 1

## 2013-10-08 MED ORDER — CHLORHEXIDINE GLUCONATE 0.12 % MT SOLN
15.0000 mL | Freq: Two times a day (BID) | OROMUCOSAL | Status: DC
  Administered 2013-10-08 – 2013-10-09 (×2): 15 mL via OROMUCOSAL
  Filled 2013-10-08 (×3): qty 15

## 2013-10-08 MED ORDER — OXYCODONE HCL 5 MG PO TABS
10.0000 mg | ORAL_TABLET | ORAL | Status: DC | PRN
  Administered 2013-10-08 – 2013-10-10 (×8): 20 mg via ORAL
  Filled 2013-10-08 (×8): qty 4

## 2013-10-08 MED ORDER — ALBUTEROL SULFATE (2.5 MG/3ML) 0.083% IN NEBU: 2.0000 mL | INHALATION_SOLUTION | Freq: Four times a day (QID) | RESPIRATORY_TRACT | Status: DC | PRN

## 2013-10-08 NOTE — Progress Notes (Signed)
Voided 
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
## 2013-10-09 ENCOUNTER — Observation Stay (HOSPITAL_COMMUNITY): Payer: No Typology Code available for payment source

## 2013-10-09 ENCOUNTER — Encounter (HOSPITAL_COMMUNITY): Payer: Self-pay | Admitting: Radiology

## 2013-10-09 LAB — CBC
HCT: 27.3 % — ABNORMAL LOW (ref 36.0–46.0)
Hemoglobin: 9.1 g/dL — ABNORMAL LOW (ref 12.0–15.0)
MCH: 30 pg (ref 26.0–34.0)
MCHC: 33.3 g/dL (ref 30.0–36.0)
MCV: 90.1 fL (ref 78.0–100.0)
PLATELETS: 277 10*3/uL (ref 150–400)
RBC: 3.03 MIL/uL — AB (ref 3.87–5.11)
RDW: 13.2 % (ref 11.5–15.5)
WBC: 6.2 10*3/uL (ref 4.0–10.5)

## 2013-10-09 LAB — BASIC METABOLIC PANEL
Anion gap: 15 (ref 5–15)
BUN: 7 mg/dL (ref 6–23)
CO2: 23 mEq/L (ref 19–32)
Calcium: 8.6 mg/dL (ref 8.4–10.5)
Chloride: 101 mEq/L (ref 96–112)
Creatinine, Ser: 0.58 mg/dL (ref 0.50–1.10)
GFR calc Af Amer: >90 mL/min (ref >90)
GFR calc non Af Amer: >90 mL/min (ref >90)
GLUCOSE: 79 mg/dL (ref 70–99)
Potassium: 4.3 mEq/L (ref 3.7–5.3)
Sodium: 139 mEq/L (ref 137–147)

## 2013-10-09 MED ORDER — ENOXAPARIN SODIUM 60 MG/0.6ML ~~LOC~~ SOLN
60.0000 mg | Freq: Two times a day (BID) | SUBCUTANEOUS | Status: DC
  Administered 2013-10-09 – 2013-10-10 (×2): 60 mg via SUBCUTANEOUS
  Filled 2013-10-09 (×4): qty 0.6

## 2013-10-09 MED ORDER — WARFARIN VIDEO
Freq: Once | Status: AC
  Administered 2013-10-09: 15:00:00

## 2013-10-09 MED ORDER — WARFARIN SODIUM 7.5 MG PO TABS
7.5000 mg | ORAL_TABLET | Freq: Once | ORAL | Status: AC
  Administered 2013-10-09: 7.5 mg via ORAL
  Filled 2013-10-09: qty 1

## 2013-10-09 MED ORDER — IOHEXOL 350 MG/ML SOLN
80.0000 mL | Freq: Once | INTRAVENOUS | Status: AC | PRN
  Administered 2013-10-09: 80 mL via INTRAVENOUS

## 2013-10-09 MED ORDER — ALPRAZOLAM 0.5 MG PO TABS
1.0000 mg | ORAL_TABLET | Freq: Every evening | ORAL | Status: DC | PRN
  Administered 2013-10-09: 1 mg via ORAL
  Filled 2013-10-09: qty 2

## 2013-10-09 MED ORDER — MORPHINE SULFATE ER 30 MG PO TBCR
30.0000 mg | EXTENDED_RELEASE_TABLET | Freq: Two times a day (BID) | ORAL | Status: DC
  Administered 2013-10-09 – 2013-10-10 (×3): 30 mg via ORAL
  Filled 2013-10-09 (×3): qty 2

## 2013-10-09 MED ORDER — WARFARIN - PHARMACIST DOSING INPATIENT: Freq: Every day | Status: DC

## 2013-10-09 MED ORDER — COUMADIN BOOK
Freq: Once | Status: AC
  Administered 2013-10-09: 13:00:00
  Filled 2013-10-09: qty 1

## 2013-10-09 MED ORDER — ENOXAPARIN SODIUM 60 MG/0.6ML ~~LOC~~ SOLN
60.0000 mg | SUBCUTANEOUS | Status: AC
  Administered 2013-10-09: 60 mg via SUBCUTANEOUS
  Filled 2013-10-09: qty 0.6

## 2013-10-09 MED ORDER — ALPRAZOLAM 0.5 MG PO TABS
1.0000 mg | ORAL_TABLET | Freq: Three times a day (TID) | ORAL | Status: DC | PRN
  Administered 2013-10-09: 1 mg via ORAL
  Filled 2013-10-09: qty 2
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
## 2013-10-09 NOTE — Progress Notes (Signed)
Utilization Review Completed.[name] [name] T9/17/2015  

## 2013-10-09 NOTE — Progress Notes (Signed)
Gave patient first cup of contrast at 2130 and told her she needed to drink that cup by 2230 and start the second cup at 2230.  I told her the contrast was necessary for the CT.  When I went back at 2230, she had only had a few sips of the first cup.  I told her she needed to finish it, start the second cup and finish it by 2330.  Her husband was at the bedside and said he would get her to drink it.  At 2315, her husband came out in the hall and told me he could not get the patient to drink the contrast.  When I returned to the room, she had not taken but a few sips from the first cup.  I encouraged her to drink the contrast; told her diagnosis and treatment could be delayed if she could not drink the contrast and have the CT performed.  She complained about not getting anything to eat.  I explained to her she would probably not be able to have food until at least the CT was done and then her diet would depend on the results of the CT.  She finally agreed to drink the contrast and at this point has consumed approximately one and one half cups.  Notified CT.

## 2013-10-09 NOTE — Progress Notes (Signed)
OT Cancellation Note  Patient Details Name: [name] [name] MRN: [id number] DOB: 05-29-1985   Cancelled Treatment:    Reason Eval/Treat Not Completed: OT screened, no needs identified, will sign off. Per PT, pt has no acute therapy needs. Will sign off at this time.   Rae Lips [phone] 10/09/2013, 12:35 PM

## 2013-10-09 NOTE — Progress Notes (Signed)
Ambulating in room. Chest pain a little better. Check dopplers. Plan D/C tomorrow with medication assistance and coumadin clinic F/U. Patient examined and I agree with the assessment and plan  Violeta Gelinas, MD, MPH, FACS Trauma: [phone] General Surgery: [phone]  10/09/2013 1:46 PM
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
## 2013-10-09 NOTE — Progress Notes (Signed)
SLP Cancellation Note  Patient Details Name: [name] [name] MRN: [id number] DOB: Dec 03, 1985   Cancelled treatment:       Reason Eval/Treat Not Completed: Patient at procedure or test/unavailable will follow up as able.  Fae Pippin, M.A., CCC-SLP [phone]  [name] [name] 10/09/2013, 11:07 AM
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
## 2013-10-09 NOTE — Progress Notes (Signed)
OT Cancellation Note  Patient Details Name: [name] [name] MRN: [id number] DOB: 27-Feb-1985   Cancelled Treatment:    Reason Eval/Treat Not Completed: Patient at procedure or test/ unavailable. Will follow up with pt for evaluation as available.   Rae Lips [phone] 10/09/2013, 9:34 AM

## 2013-10-09 NOTE — Progress Notes (Signed)
Pt will require PCP follow up for Coumadin monitoring. Arranged for appt with [facility] on [address] for Monday 10/13/2013 at 0900.  Pt will need to bring her d/c paperwork with her to appointment.   She will also require MATCH assistance with medications. It is normally limited to one time/12 months but since she has an acute PE, I will override the case to allow her to get prescribed Lovenox at d/c.   Carlyle Lipa, RN BSN MHA CCM  Case Manager, Trauma Service/Unit 16M [phone]

## 2013-10-10 DIAGNOSIS — M79609 Pain in unspecified limb: Secondary | ICD-10-CM

## 2013-10-10 DIAGNOSIS — I2699 Other pulmonary embolism without acute cor pulmonale: Secondary | ICD-10-CM

## 2013-10-10 LAB — PROTIME-INR
INR: 1.3 (ref 0.00–1.49)
PROTHROMBIN TIME: 16.2 s — AB (ref 11.6–15.2)

## 2013-10-10 MED ORDER — WARFARIN SODIUM 5 MG PO TABS
7.5000 mg | ORAL_TABLET | Freq: Every day | ORAL | Status: DC
  Filled 2013-10-10: qty 1.5

## 2013-10-10 MED ORDER — WARFARIN SODIUM 5 MG PO TABS: ORAL_TABLET | ORAL | Status: DC

## 2013-10-10 MED ORDER — ENOXAPARIN SODIUM 80 MG/0.8ML ~~LOC~~ SOLN: 1.5000 mg/kg | SUBCUTANEOUS | Status: DC

## 2013-10-10 MED ORDER — ENOXAPARIN SODIUM 80 MG/0.8ML ~~LOC~~ SOLN
1.5000 mg/kg | SUBCUTANEOUS | Status: DC
  Administered 2013-10-10: 80 mg via SUBCUTANEOUS
  Filled 2013-10-10: qty 0.8

## 2013-10-10 MED ORDER — OXYCODONE-ACETAMINOPHEN 10-325 MG PO TABS: 1.0000 | ORAL_TABLET | ORAL | Status: DC | PRN

## 2013-10-10 MED ORDER — MORPHINE SULFATE ER 15 MG PO TBCR: EXTENDED_RELEASE_TABLET | ORAL | Status: DC
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
## 2013-10-10 NOTE — Progress Notes (Signed)
Discharge note. Reviewed discharge instructions and medications with pt and pt's family at the bedside. Pt's family member stated she will be giving pt the lovenox injection at home and had given similar injections in the bad. Pt's family member was able to demonstrate and give pt's dose of lovenox for today. Pt was given smoking cessation information and reports she is aware of the risks of smoking with current diagnosis. Pt states she is ready for discharge.
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
## 2013-10-10 NOTE — Progress Notes (Addendum)
Bilateral lower and upper extremity venous duplex completed.  Bilateral lower extremity:  No evidence of DVT, superficial thrombosis, or Baker's cyst.  Bilateral upper extremity:  No evidence of DVT.  There appears to be superficial thrombus in the left cephalic vein in the forearm.
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
## 2013-10-10 NOTE — Discharge Summary (Signed)
Alister Staver, MD, MPH, FACS Trauma: [phone] General Surgery: [phone]  
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
## 2013-10-10 NOTE — Progress Notes (Signed)
Home after dopplers Patient examined and I agree with the assessment and plan  Violeta Gelinas, MD, MPH, FACS Trauma: [phone] General Surgery: [phone]  10/10/2013 10:54 AM

## 2013-10-10 NOTE — Discharge Instructions (Signed)
Information on my medicine - Coumadin   (Warfarin)  This medication education was reviewed with me or my healthcare representative as part of my discharge preparation.  The pharmacist that spoke with me during my hospital stay was:  Herby Abraham, [facility]  Why was Coumadin prescribed for you? Coumadin was prescribed for you because you have a blood clot or a medical condition that can cause an increased risk of forming blood clots. Blood clots can cause serious health problems by blocking the flow of blood to the heart, lung, or brain. Coumadin can prevent harmful blood clots from forming. As a reminder your indication for Coumadin is:   Pulmonary Embolism Treatment  What test will check on my response to Coumadin? While on Coumadin (warfarin) you will need to have an INR test regularly to ensure that your dose is keeping you in the desired range. The INR (international normalized ratio) number is calculated from the result of the laboratory test called prothrombin time (PT).  If an INR APPOINTMENT HAS NOT ALREADY BEEN MADE FOR YOU please schedule an appointment to have this lab work done by your health care provider within 7 days. Your INR goal is usually a number between:  2 to 3 or your provider may give you a more narrow range like 2-2.5.  Ask your health care provider during an office visit what your goal INR is.  What  do you need to  know  About  COUMADIN? Take Coumadin (warfarin) exactly as prescribed by your healthcare provider about the same time each day.  DO NOT stop taking without talking to the doctor who prescribed the medication.  Stopping without other blood clot prevention medication to take the place of Coumadin may increase your risk of developing a new clot or stroke.  Get refills before you run out.  What do you do if you miss a dose? If you miss a dose, take it as soon as you remember on the same day then continue your regularly scheduled regimen the next day.  Do not take  two doses of Coumadin at the same time.  Important Safety Information A possible side effect of Coumadin (Warfarin) is an increased risk of bleeding. You should call your healthcare provider right away if you experience any of the following:   Bleeding from an injury or your nose that does not stop.   Unusual colored urine (red or dark brown) or unusual colored stools (red or black).   Unusual bruising for unknown reasons.   A serious fall or if you hit your head (even if there is no bleeding).  Some foods or medicines interact with Coumadin (warfarin) and might alter your response to warfarin. To help avoid this:   Eat a balanced diet, maintaining a consistent amount of Vitamin K.   Notify your provider about major diet changes you plan to make.   Avoid alcohol or limit your intake to 1 drink for women and 2 drinks for men per day. (1 drink is 5 oz. wine, 12 oz. beer, or 1.5 oz. liquor.)  Make sure that ANY health care provider who prescribes medication for you knows that you are taking Coumadin (warfarin).  Also make sure the healthcare provider who is monitoring your Coumadin knows when you have started a new medication including herbals and non-prescription products.  Coumadin (Warfarin)  Major Drug Interactions  Increased Warfarin Effect Decreased Warfarin Effect  Alcohol (large quantities) Antibiotics (esp. Septra/Bactrim, Flagyl, Cipro) Amiodarone (Cordarone) Aspirin (ASA) Cimetidine (Tagamet)  Megestrol (Megace) NSAIDs (ibuprofen, naproxen, etc.) Piroxicam (Feldene) Propafenone (Rythmol SR) Propranolol (Inderal) Isoniazid (INH) Posaconazole (Noxafil) Barbiturates (Phenobarbital) Carbamazepine (Tegretol) Chlordiazepoxide (Librium) Cholestyramine (Questran) Griseofulvin Oral Contraceptives Rifampin Sucralfate (Carafate) Vitamin K   Coumadin (Warfarin) Major Herbal Interactions  Increased Warfarin Effect Decreased Warfarin Effect  Garlic Ginseng Ginkgo biloba  Coenzyme Q10 Green tea St. Johns wort    Coumadin (Warfarin) FOOD Interactions  Eat a consistent number of servings per week of foods HIGH in Vitamin K (1 serving =  cup)  Collards (cooked, or boiled & drained) Kale (cooked, or boiled & drained) Mustard greens (cooked, or boiled & drained) Parsley *serving size only =  cup Spinach (cooked, or boiled & drained) Swiss chard (cooked, or boiled & drained) Turnip greens (cooked, or boiled & drained)  Eat a consistent number of servings per week of foods MEDIUM-HIGH in Vitamin K (1 serving = 1 cup)  Asparagus (cooked, or boiled & drained) Broccoli (cooked, boiled & drained, or raw & chopped) Brussel sprouts (cooked, or boiled & drained) *serving size only =  cup Lettuce, raw (green leaf, endive, romaine) Spinach, raw Turnip greens, raw & chopped   These websites have more information on Coumadin (warfarin):  http://www.king-russell.com/; https://www.hines.net/;  Information on my medicine - Coumadin   (Warfarin)  This medication education was reviewed with me or my healthcare representative as part of my discharge preparation.  The pharmacist that spoke with me during my hospital stay was:  Herby Abraham, [facility]  Why was Coumadin prescribed for you? Coumadin was prescribed for you because you have a blood clot or a medical condition that can cause an increased risk of forming blood clots. Blood clots can cause serious health problems by blocking the flow of blood to the heart, lung, or brain. Coumadin can prevent harmful blood clots from forming. As a reminder your indication for Coumadin is:   Pulmonary Embolism Treatment  What test will check on my response to Coumadin? While on Coumadin (warfarin) you will need to have an INR test regularly to ensure that your dose is keeping you in the desired range. The INR (international normalized ratio) number is calculated from the result of the laboratory test called prothrombin time  (PT).  If an INR APPOINTMENT HAS NOT ALREADY BEEN MADE FOR YOU please schedule an appointment to have this lab work done by your health care provider within 7 days. Your INR goal is usually a number between:  2 to 3 or your provider may give you a more narrow range like 2-2.5.  Ask your health care provider during an office visit what your goal INR is.  What  do you need to  know  About  COUMADIN? Take Coumadin (warfarin) exactly as prescribed by your healthcare provider about the same time each day.  DO NOT stop taking without talking to the doctor who prescribed the medication.  Stopping without other blood clot prevention medication to take the place of Coumadin may increase your risk of developing a new clot or stroke.  Get refills before you run out.  What do you do if you miss a dose? If you miss a dose, take it as soon as you remember on the same day then continue your regularly scheduled regimen the next day.  Do not take two doses of Coumadin at the same time.  Important Safety Information A possible side effect of Coumadin (Warfarin) is an increased risk of bleeding. You should call your healthcare provider right away if you experience  any of the following:   Bleeding from an injury or your nose that does not stop.   Unusual colored urine (red or dark brown) or unusual colored stools (red or black).   Unusual bruising for unknown reasons.   A serious fall or if you hit your head (even if there is no bleeding).  Some foods or medicines interact with Coumadin (warfarin) and might alter your response to warfarin. To help avoid this:   Eat a balanced diet, maintaining a consistent amount of Vitamin K.   Notify your provider about major diet changes you plan to make.   Avoid alcohol or limit your intake to 1 drink for women and 2 drinks for men per day. (1 drink is 5 oz. wine, 12 oz. beer, or 1.5 oz. liquor.)  Make sure that ANY health care provider who prescribes medication for you  knows that you are taking Coumadin (warfarin).  Also make sure the healthcare provider who is monitoring your Coumadin knows when you have started a new medication including herbals and non-prescription products.  Coumadin (Warfarin)  Major Drug Interactions  Increased Warfarin Effect Decreased Warfarin Effect  Alcohol (large quantities) Antibiotics (esp. Septra/Bactrim, Flagyl, Cipro) Amiodarone (Cordarone) Aspirin (ASA) Cimetidine (Tagamet) Megestrol (Megace) NSAIDs (ibuprofen, naproxen, etc.) Piroxicam (Feldene) Propafenone (Rythmol SR) Propranolol (Inderal) Isoniazid (INH) Posaconazole (Noxafil) Barbiturates (Phenobarbital) Carbamazepine (Tegretol) Chlordiazepoxide (Librium) Cholestyramine (Questran) Griseofulvin Oral Contraceptives Rifampin Sucralfate (Carafate) Vitamin K   Coumadin (Warfarin) Major Herbal Interactions  Increased Warfarin Effect Decreased Warfarin Effect  Garlic Ginseng Ginkgo biloba Coenzyme Q10 Green tea St. Johns wort    Coumadin (Warfarin) FOOD Interactions  Eat a consistent number of servings per week of foods HIGH in Vitamin K (1 serving =  cup)  Collards (cooked, or boiled & drained) Kale (cooked, or boiled & drained) Mustard greens (cooked, or boiled & drained) Parsley *serving size only =  cup Spinach (cooked, or boiled & drained) Swiss chard (cooked, or boiled & drained) Turnip greens (cooked, or boiled & drained)  Eat a consistent number of servings per week of foods MEDIUM-HIGH in Vitamin K (1 serving = 1 cup)  Asparagus (cooked, or boiled & drained) Broccoli (cooked, boiled & drained, or raw & chopped) Brussel sprouts (cooked, or boiled & drained) *serving size only =  cup Lettuce, raw (green leaf, endive, romaine) Spinach, raw Turnip greens, raw & chopped   These websites have more information on Coumadin (warfarin):  http://www.king-russell.com/; https://www.hines.net/;
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
## 2013-10-13 ENCOUNTER — Ambulatory Visit: Payer: Self-pay | Attending: Internal Medicine | Admitting: Internal Medicine

## 2013-10-13 ENCOUNTER — Encounter: Payer: Self-pay | Admitting: Internal Medicine

## 2013-10-13 VITALS — BP 113/75 | HR 70 | Temp 98.0°F | Resp 16 | Wt 121.0 lb

## 2013-10-13 DIAGNOSIS — Z7901 Long term (current) use of anticoagulants: Secondary | ICD-10-CM | POA: Insufficient documentation

## 2013-10-13 DIAGNOSIS — I749 Embolism and thrombosis of unspecified artery: Secondary | ICD-10-CM

## 2013-10-13 DIAGNOSIS — D649 Anemia, unspecified: Secondary | ICD-10-CM

## 2013-10-13 DIAGNOSIS — I8290 Acute embolism and thrombosis of unspecified vein: Secondary | ICD-10-CM

## 2013-10-13 DIAGNOSIS — J45909 Unspecified asthma, uncomplicated: Secondary | ICD-10-CM

## 2013-10-13 DIAGNOSIS — Z87891 Personal history of nicotine dependence: Secondary | ICD-10-CM | POA: Insufficient documentation

## 2013-10-13 DIAGNOSIS — Z139 Encounter for screening, unspecified: Secondary | ICD-10-CM

## 2013-10-13 DIAGNOSIS — I2699 Other pulmonary embolism without acute cor pulmonale: Secondary | ICD-10-CM

## 2013-10-13 DIAGNOSIS — Z8781 Personal history of (healed) traumatic fracture: Secondary | ICD-10-CM

## 2013-10-13 LAB — ANEMIA PANEL
%SAT: 26 % (ref 20–55)
ABS Retic: 28.4 10*3/uL (ref 19.0–186.0)
Ferritin: 335 ng/mL — ABNORMAL HIGH (ref 10–291)
Folate: 11.7 ng/mL
IRON: 57 ug/dL (ref 42–145)
RBC.: 4.05 MIL/uL (ref 3.87–5.11)
Retic Ct Pct: 0.7 % (ref 0.4–2.3)
TIBC: 217 ug/dL — AB (ref 250–470)
UIBC: 160 ug/dL (ref 125–400)
VITAMIN B 12: 576 pg/mL (ref 211–911)

## 2013-10-13 LAB — COMPLETE METABOLIC PANEL WITH GFR
ALT: 12 U/L (ref 0–35)
AST: 26 U/L (ref 0–37)
Albumin: 3.8 g/dL (ref 3.5–5.2)
Alkaline Phosphatase: 75 U/L (ref 39–117)
BILIRUBIN TOTAL: 0.2 mg/dL (ref 0.2–1.2)
BUN: 11 mg/dL (ref 6–23)
CO2: 27 meq/L (ref 19–32)
Calcium: 10 mg/dL (ref 8.4–10.5)
Chloride: 103 mEq/L (ref 96–112)
Creat: 0.77 mg/dL (ref 0.50–1.10)
GFR, Est Non African American: >89 mL/min
Glucose, Bld: 68 mg/dL — ABNORMAL LOW (ref 70–99)
POTASSIUM: 5.3 meq/L (ref 3.5–5.3)
Sodium: 138 mEq/L (ref 135–145)
Total Protein: 8.5 g/dL — ABNORMAL HIGH (ref 6.0–8.3)

## 2013-10-13 LAB — TSH: TSH: 1.237 u[IU]/mL (ref 0.350–4.500)

## 2013-10-13 LAB — POCT INR: INR: 1.8

## 2013-10-13 MED ORDER — RIVAROXABAN 20 MG PO TABS: 20.0000 mg | ORAL_TABLET | Freq: Every day | ORAL | Status: DC

## 2013-10-13 MED ORDER — RIVAROXABAN 15 MG PO TABS: 15.0000 mg | ORAL_TABLET | Freq: Two times a day (BID) | ORAL | Status: DC

## 2013-10-13 NOTE — Progress Notes (Signed)
Patient here to establish care Presently on coumadin for blood clots
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
## 2013-10-14 LAB — VITAMIN D 25 HYDROXY (VIT D DEFICIENCY, FRACTURES): Vit D, 25-Hydroxy: 53 ng/mL (ref 30–89)

## 2013-10-17 ENCOUNTER — Emergency Department (HOSPITAL_COMMUNITY)
Admission: EM | Admit: 2013-10-17 | Discharge: 2013-10-17 | Disposition: A | Discharge status: Home / Self Care | Payer: No Typology Code available for payment source | Attending: Emergency Medicine | Admitting: Emergency Medicine

## 2013-10-17 ENCOUNTER — Encounter (HOSPITAL_COMMUNITY): Payer: Self-pay | Admitting: Emergency Medicine

## 2013-10-17 ENCOUNTER — Emergency Department (HOSPITAL_COMMUNITY): Payer: No Typology Code available for payment source

## 2013-10-17 DIAGNOSIS — Z79899 Other long term (current) drug therapy: Secondary | ICD-10-CM | POA: Insufficient documentation

## 2013-10-17 DIAGNOSIS — Z8781 Personal history of (healed) traumatic fracture: Secondary | ICD-10-CM | POA: Insufficient documentation

## 2013-10-17 DIAGNOSIS — M545 Low back pain, unspecified: Secondary | ICD-10-CM | POA: Insufficient documentation

## 2013-10-17 DIAGNOSIS — J45909 Unspecified asthma, uncomplicated: Secondary | ICD-10-CM | POA: Insufficient documentation

## 2013-10-17 DIAGNOSIS — F17200 Nicotine dependence, unspecified, uncomplicated: Secondary | ICD-10-CM | POA: Insufficient documentation

## 2013-10-17 DIAGNOSIS — F329 Major depressive disorder, single episode, unspecified: Secondary | ICD-10-CM | POA: Insufficient documentation

## 2013-10-17 DIAGNOSIS — IMO0002 Reserved for concepts with insufficient information to code with codable children: Secondary | ICD-10-CM | POA: Insufficient documentation

## 2013-10-17 DIAGNOSIS — Z7901 Long term (current) use of anticoagulants: Secondary | ICD-10-CM | POA: Insufficient documentation

## 2013-10-17 DIAGNOSIS — F3289 Other specified depressive episodes: Secondary | ICD-10-CM | POA: Insufficient documentation

## 2013-10-17 DIAGNOSIS — R0602 Shortness of breath: Secondary | ICD-10-CM | POA: Insufficient documentation

## 2013-10-17 DIAGNOSIS — Z87828 Personal history of other (healed) physical injury and trauma: Secondary | ICD-10-CM | POA: Insufficient documentation

## 2013-10-17 DIAGNOSIS — Z791 Long term (current) use of non-steroidal anti-inflammatories (NSAID): Secondary | ICD-10-CM | POA: Insufficient documentation

## 2013-10-17 DIAGNOSIS — Z86711 Personal history of pulmonary embolism: Secondary | ICD-10-CM | POA: Insufficient documentation

## 2013-10-17 DIAGNOSIS — F411 Generalized anxiety disorder: Secondary | ICD-10-CM | POA: Insufficient documentation

## 2013-10-17 HISTORY — DX: Other pulmonary embolism without acute cor pulmonale: I26.99

## 2013-10-17 HISTORY — DX: Fracture of one rib, left side, initial encounter for closed fracture: S22.32XA

## 2013-10-17 MED ORDER — OXYCODONE-ACETAMINOPHEN 5-325 MG PO TABS
2.0000 | ORAL_TABLET | Freq: Once | ORAL | Status: AC
  Administered 2013-10-17: 2 via ORAL
  Filled 2013-10-17: qty 2

## 2013-10-17 MED ORDER — OXYCODONE-ACETAMINOPHEN 5-325 MG PO TABS: 2.0000 | ORAL_TABLET | ORAL | Status: DC | PRN

## 2013-10-17 NOTE — ED Notes (Signed)
Patient given discharge instruction, verbalized understand. Patient ambulatory out of the department.  

## 2013-10-17 NOTE — ED Notes (Signed)
Pt is currently taking Lovenox injections and coumadin
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
## 2013-10-17 NOTE — ED Notes (Signed)
MD at the bedside  
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
## 2013-10-17 NOTE — Discharge Instructions (Signed)
Your vital signs were normal. Continue taking Lovenox and Coumadin.  Prescription for pain medication

## 2013-10-28 ENCOUNTER — Encounter (HOSPITAL_COMMUNITY): Payer: Self-pay | Admitting: Emergency Medicine

## 2013-10-28 ENCOUNTER — Emergency Department (HOSPITAL_COMMUNITY): Payer: No Typology Code available for payment source

## 2013-10-28 ENCOUNTER — Emergency Department (HOSPITAL_COMMUNITY)
Admission: EM | Admit: 2013-10-28 | Discharge: 2013-10-28 | Disposition: A | Discharge status: Home / Self Care | Payer: No Typology Code available for payment source | Attending: Emergency Medicine | Admitting: Emergency Medicine

## 2013-10-28 DIAGNOSIS — Z8781 Personal history of (healed) traumatic fracture: Secondary | ICD-10-CM | POA: Insufficient documentation

## 2013-10-28 DIAGNOSIS — Z7951 Long term (current) use of inhaled steroids: Secondary | ICD-10-CM | POA: Insufficient documentation

## 2013-10-28 DIAGNOSIS — Z7901 Long term (current) use of anticoagulants: Secondary | ICD-10-CM | POA: Insufficient documentation

## 2013-10-28 DIAGNOSIS — M549 Dorsalgia, unspecified: Secondary | ICD-10-CM | POA: Insufficient documentation

## 2013-10-28 DIAGNOSIS — Z87828 Personal history of other (healed) physical injury and trauma: Secondary | ICD-10-CM | POA: Insufficient documentation

## 2013-10-28 DIAGNOSIS — M542 Cervicalgia: Secondary | ICD-10-CM | POA: Insufficient documentation

## 2013-10-28 DIAGNOSIS — Z79899 Other long term (current) drug therapy: Secondary | ICD-10-CM | POA: Insufficient documentation

## 2013-10-28 DIAGNOSIS — Z720 Tobacco use: Secondary | ICD-10-CM | POA: Insufficient documentation

## 2013-10-28 DIAGNOSIS — G8918 Other acute postprocedural pain: Secondary | ICD-10-CM | POA: Insufficient documentation

## 2013-10-28 DIAGNOSIS — Z86711 Personal history of pulmonary embolism: Secondary | ICD-10-CM | POA: Insufficient documentation

## 2013-10-28 DIAGNOSIS — J45909 Unspecified asthma, uncomplicated: Secondary | ICD-10-CM | POA: Insufficient documentation

## 2013-10-28 DIAGNOSIS — R0782 Intercostal pain: Secondary | ICD-10-CM | POA: Insufficient documentation

## 2013-10-28 DIAGNOSIS — Z8659 Personal history of other mental and behavioral disorders: Secondary | ICD-10-CM | POA: Insufficient documentation

## 2013-10-28 LAB — CBC WITH DIFFERENTIAL/PLATELET
Basophils Absolute: 0 10*3/uL (ref 0.0–0.1)
Basophils Relative: 0 % (ref 0–1)
EOS ABS: 0.4 10*3/uL (ref 0.0–0.7)
EOS PCT: 4 % (ref 0–5)
HCT: 29.5 % — ABNORMAL LOW (ref 36.0–46.0)
Hemoglobin: 9.8 g/dL — ABNORMAL LOW (ref 12.0–15.0)
Lymphocytes Relative: 23 % (ref 12–46)
Lymphs Abs: 1.9 10*3/uL (ref 0.7–4.0)
MCH: 28.9 pg (ref 26.0–34.0)
MCHC: 33.2 g/dL (ref 30.0–36.0)
MCV: 87 fL (ref 78.0–100.0)
Monocytes Absolute: 0.7 10*3/uL (ref 0.1–1.0)
Monocytes Relative: 8 % (ref 3–12)
NEUTROS PCT: 65 % (ref 43–77)
Neutro Abs: 5.5 10*3/uL (ref 1.7–7.7)
PLATELETS: 439 10*3/uL — AB (ref 150–400)
RBC: 3.39 MIL/uL — ABNORMAL LOW (ref 3.87–5.11)
RDW: 14.1 % (ref 11.5–15.5)
WBC: 8.6 10*3/uL (ref 4.0–10.5)

## 2013-10-28 LAB — COMPREHENSIVE METABOLIC PANEL
ALBUMIN: 2.6 g/dL — AB (ref 3.5–5.2)
ALK PHOS: 85 U/L (ref 39–117)
ALT: 30 U/L (ref 0–35)
ANION GAP: 13 (ref 5–15)
AST: 44 U/L — ABNORMAL HIGH (ref 0–37)
BUN: 9 mg/dL (ref 6–23)
CALCIUM: 9.2 mg/dL (ref 8.4–10.5)
CO2: 27 mEq/L (ref 19–32)
Chloride: 99 mEq/L (ref 96–112)
Creatinine, Ser: 0.52 mg/dL (ref 0.50–1.10)
GFR calc Af Amer: >90 mL/min (ref >90)
GFR calc non Af Amer: >90 mL/min (ref >90)
Glucose, Bld: 88 mg/dL (ref 70–99)
POTASSIUM: 3.7 meq/L (ref 3.7–5.3)
SODIUM: 139 meq/L (ref 137–147)
Total Bilirubin: 0.3 mg/dL (ref 0.3–1.2)
Total Protein: 8.5 g/dL — ABNORMAL HIGH (ref 6.0–8.3)

## 2013-10-28 MED ORDER — ONDANSETRON HCL 4 MG/2ML IJ SOLN
4.0000 mg | Freq: Once | INTRAMUSCULAR | Status: AC
  Administered 2013-10-28: 4 mg via INTRAVENOUS
  Filled 2013-10-28: qty 2

## 2013-10-28 MED ORDER — IOHEXOL 350 MG/ML SOLN
100.0000 mL | Freq: Once | INTRAVENOUS | Status: AC | PRN
  Administered 2013-10-28: 100 mL via INTRAVENOUS

## 2013-10-28 MED ORDER — HYDROMORPHONE HCL 1 MG/ML IJ SOLN
1.0000 mg | Freq: Once | INTRAMUSCULAR | Status: AC
  Administered 2013-10-28: 1 mg via INTRAVENOUS
  Filled 2013-10-28: qty 1

## 2013-10-28 MED ORDER — OXYCODONE-ACETAMINOPHEN 5-325 MG PO TABS: 1.0000 | ORAL_TABLET | Freq: Four times a day (QID) | ORAL | Status: DC | PRN
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
## 2013-10-28 NOTE — Discharge Instructions (Signed)
[facility] or a lung md if you had seen one.   Follow up in a week.

## 2013-10-28 NOTE — ED Notes (Signed)
Pt states recently dx with PE. States pain to right side and back pain worse and shooting up neck and arm. Pt states SOB.

## 2013-10-29 ENCOUNTER — Telehealth (HOSPITAL_COMMUNITY): Payer: Self-pay

## 2013-10-29 NOTE — Telephone Encounter (Signed)
Reviewed case with Dr. Lindie SpruceWyatt who agrees that patient likely needs VATS or similar on that right side. Spoke with [facility] and Dr. Lindie SpruceWyatt spoke with Dr. Tyrone SageGerhardt who will try to get the patient seen in the next couple of days. Conveyed that information to patient's mother and asked her to call if she had not heard anything by mid-afternoon.

## 2013-10-30 ENCOUNTER — Encounter: Payer: Self-pay | Admitting: Cardiothoracic Surgery

## 2013-10-30 ENCOUNTER — Inpatient Hospital Stay (HOSPITAL_COMMUNITY): Payer: No Typology Code available for payment source

## 2013-10-30 ENCOUNTER — Inpatient Hospital Stay (HOSPITAL_COMMUNITY)
Admission: AD | Admit: 2013-10-30 | Discharge: 2013-11-09 | DRG: 163 | Disposition: A | Discharge status: Home / Self Care | Payer: Self-pay | Source: Ambulatory Visit | Attending: Cardiothoracic Surgery | Admitting: Cardiothoracic Surgery

## 2013-10-30 ENCOUNTER — Other Ambulatory Visit: Payer: Self-pay

## 2013-10-30 ENCOUNTER — Encounter (HOSPITAL_COMMUNITY): Payer: Self-pay | Admitting: Radiology

## 2013-10-30 ENCOUNTER — Institutional Professional Consult (permissible substitution) (INDEPENDENT_AMBULATORY_CARE_PROVIDER_SITE_OTHER): Payer: Self-pay | Admitting: Cardiothoracic Surgery

## 2013-10-30 VITALS — BP 113/74 | HR 94 | Ht 67.0 in | Wt 121.0 lb

## 2013-10-30 DIAGNOSIS — R911 Solitary pulmonary nodule: Secondary | ICD-10-CM

## 2013-10-30 DIAGNOSIS — J90 Pleural effusion, not elsewhere classified: Secondary | ICD-10-CM

## 2013-10-30 DIAGNOSIS — Z885 Allergy status to narcotic agent status: Secondary | ICD-10-CM

## 2013-10-30 DIAGNOSIS — F17210 Nicotine dependence, cigarettes, uncomplicated: Secondary | ICD-10-CM | POA: Diagnosis present

## 2013-10-30 DIAGNOSIS — J948 Other specified pleural conditions: Secondary | ICD-10-CM

## 2013-10-30 DIAGNOSIS — I2699 Other pulmonary embolism without acute cor pulmonale: Secondary | ICD-10-CM | POA: Diagnosis present

## 2013-10-30 DIAGNOSIS — D62 Acute posthemorrhagic anemia: Secondary | ICD-10-CM | POA: Diagnosis not present

## 2013-10-30 DIAGNOSIS — J45909 Unspecified asthma, uncomplicated: Secondary | ICD-10-CM | POA: Diagnosis present

## 2013-10-30 DIAGNOSIS — Z8249 Family history of ischemic heart disease and other diseases of the circulatory system: Secondary | ICD-10-CM

## 2013-10-30 DIAGNOSIS — F329 Major depressive disorder, single episode, unspecified: Secondary | ICD-10-CM | POA: Diagnosis present

## 2013-10-30 DIAGNOSIS — J850 Gangrene and necrosis of lung: Principal | ICD-10-CM | POA: Diagnosis present

## 2013-10-30 DIAGNOSIS — J869 Pyothorax without fistula: Secondary | ICD-10-CM

## 2013-10-30 DIAGNOSIS — Z889 Allergy status to unspecified drugs, medicaments and biological substances status: Secondary | ICD-10-CM

## 2013-10-30 DIAGNOSIS — T819XXA Unspecified complication of procedure, initial encounter: Secondary | ICD-10-CM

## 2013-10-30 DIAGNOSIS — G8929 Other chronic pain: Secondary | ICD-10-CM | POA: Diagnosis present

## 2013-10-30 DIAGNOSIS — J984 Other disorders of lung: Secondary | ICD-10-CM

## 2013-10-30 DIAGNOSIS — F419 Anxiety disorder, unspecified: Secondary | ICD-10-CM | POA: Diagnosis present

## 2013-10-30 DIAGNOSIS — J852 Abscess of lung without pneumonia: Secondary | ICD-10-CM | POA: Diagnosis present

## 2013-10-30 DIAGNOSIS — S32302D Unspecified fracture of left ilium, subsequent encounter for fracture with routine healing: Secondary | ICD-10-CM

## 2013-10-30 DIAGNOSIS — G4700 Insomnia, unspecified: Secondary | ICD-10-CM | POA: Diagnosis present

## 2013-10-30 DIAGNOSIS — Z7901 Long term (current) use of anticoagulants: Secondary | ICD-10-CM

## 2013-10-30 DIAGNOSIS — J9811 Atelectasis: Secondary | ICD-10-CM

## 2013-10-30 DIAGNOSIS — S2242XG Multiple fractures of ribs, left side, subsequent encounter for fracture with delayed healing: Secondary | ICD-10-CM

## 2013-10-30 DIAGNOSIS — J939 Pneumothorax, unspecified: Secondary | ICD-10-CM

## 2013-10-30 LAB — CBC
HCT: 29.1 % — ABNORMAL LOW (ref 36.0–46.0)
HEMOGLOBIN: 9.5 g/dL — AB (ref 12.0–15.0)
MCH: 28.1 pg (ref 26.0–34.0)
MCHC: 32.6 g/dL (ref 30.0–36.0)
MCV: 86.1 fL (ref 78.0–100.0)
PLATELETS: 427 10*3/uL — AB (ref 150–400)
RBC: 3.38 MIL/uL — AB (ref 3.87–5.11)
RDW: 14.2 % (ref 11.5–15.5)
WBC: 8.1 10*3/uL (ref 4.0–10.5)

## 2013-10-30 LAB — BASIC METABOLIC PANEL
ANION GAP: 13 (ref 5–15)
BUN: 9 mg/dL (ref 6–23)
CHLORIDE: 100 meq/L (ref 96–112)
CO2: 26 mEq/L (ref 19–32)
Calcium: 8.8 mg/dL (ref 8.4–10.5)
Creatinine, Ser: 0.55 mg/dL (ref 0.50–1.10)
GFR calc non Af Amer: >90 mL/min (ref >90)
Glucose, Bld: 109 mg/dL — ABNORMAL HIGH (ref 70–99)
Potassium: 3.4 mEq/L — ABNORMAL LOW (ref 3.7–5.3)
SODIUM: 139 meq/L (ref 137–147)

## 2013-10-30 LAB — MRSA PCR SCREENING: MRSA by PCR: NEGATIVE

## 2013-10-30 MED ORDER — ONDANSETRON HCL 4 MG/2ML IJ SOLN
4.0000 mg | Freq: Four times a day (QID) | INTRAMUSCULAR | Status: DC | PRN
  Administered 2013-11-02: 4 mg via INTRAVENOUS
  Filled 2013-10-30: qty 2

## 2013-10-30 MED ORDER — ALBUTEROL SULFATE (2.5 MG/3ML) 0.083% IN NEBU: 2.5000 mg | INHALATION_SOLUTION | Freq: Four times a day (QID) | RESPIRATORY_TRACT | Status: DC | PRN

## 2013-10-30 MED ORDER — RIVAROXABAN 15 MG PO TABS
15.0000 mg | ORAL_TABLET | Freq: Once | ORAL | Status: DC
  Filled 2013-10-30: qty 1

## 2013-10-30 MED ORDER — KCL IN DEXTROSE-NACL 20-5-0.9 MEQ/L-%-% IV SOLN
INTRAVENOUS | Status: DC
  Administered 2013-10-30: 16:00:00 via INTRAVENOUS
  Filled 2013-10-30 (×5): qty 1000

## 2013-10-30 MED ORDER — MOMETASONE FURO-FORMOTEROL FUM 200-5 MCG/ACT IN AERO
2.0000 | INHALATION_SPRAY | Freq: Two times a day (BID) | RESPIRATORY_TRACT | Status: DC
  Administered 2013-10-30 – 2013-11-09 (×18): 2 via RESPIRATORY_TRACT
  Filled 2013-10-30 (×3): qty 8.8

## 2013-10-30 MED ORDER — OXYCODONE HCL 5 MG PO TABS
5.0000 mg | ORAL_TABLET | ORAL | Status: DC | PRN
Start: 2013-10-30 — End: 2013-10-31
  Administered 2013-10-30 – 2013-10-31 (×5): 5 mg via ORAL
  Filled 2013-10-30 (×5): qty 1

## 2013-10-30 MED ORDER — ACETAMINOPHEN 325 MG PO TABS
650.0000 mg | ORAL_TABLET | Freq: Four times a day (QID) | ORAL | Status: DC | PRN
  Administered 2013-10-30: 650 mg via ORAL
  Filled 2013-10-30: qty 2

## 2013-10-30 NOTE — Progress Notes (Signed)
MD at bedside with day shift RN and night shift RN to discuss plan for IVC filter placement tomorrow and current pain management. No new orders received. Will continue to monitor.
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
## 2013-10-30 NOTE — Progress Notes (Signed)
Pt c/o pain 12/10 in back. PRN dose of 5mg  oxycodone offered but pt refuses pain meds and states, "it doesn't work". Pt is threatening to leave AMA. Dr. Tyrone SageGerhardt notified. No new orders received. Will continue to monitor.
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
## 2013-10-31 ENCOUNTER — Inpatient Hospital Stay (HOSPITAL_COMMUNITY): Payer: No Typology Code available for payment source

## 2013-10-31 LAB — HCG, SERUM, QUALITATIVE: PREG SERUM: NEGATIVE

## 2013-10-31 SURGERY — Surgical Case
Anesthesia: *Unknown

## 2013-10-31 MED ORDER — FENTANYL CITRATE 0.05 MG/ML IJ SOLN
INTRAMUSCULAR | Status: AC
  Filled 2013-10-31: qty 4

## 2013-10-31 MED ORDER — ENSURE COMPLETE PO LIQD
237.0000 mL | Freq: Two times a day (BID) | ORAL | Status: DC
  Administered 2013-10-31 – 2013-11-08 (×13): 237 mL via ORAL

## 2013-10-31 MED ORDER — MIDAZOLAM HCL 2 MG/2ML IJ SOLN
INTRAMUSCULAR | Status: AC
  Filled 2013-10-31: qty 4

## 2013-10-31 MED ORDER — ALPRAZOLAM 0.25 MG PO TABS
0.2500 mg | ORAL_TABLET | Freq: Two times a day (BID) | ORAL | Status: DC | PRN
  Administered 2013-10-31 – 2013-11-08 (×12): 0.25 mg via ORAL
  Filled 2013-10-31 (×13): qty 1

## 2013-10-31 MED ORDER — FENTANYL CITRATE 0.05 MG/ML IJ SOLN
INTRAMUSCULAR | Status: AC | PRN
  Administered 2013-10-31: 50 ug via INTRAVENOUS

## 2013-10-31 MED ORDER — MIDAZOLAM HCL 2 MG/2ML IJ SOLN
INTRAMUSCULAR | Status: AC | PRN
  Administered 2013-10-31: 1 mg via INTRAVENOUS

## 2013-10-31 MED ORDER — IOHEXOL 300 MG/ML  SOLN
100.0000 mL | Freq: Once | INTRAMUSCULAR | Status: AC | PRN
  Administered 2013-10-31: 45 mL via INTRAVENOUS

## 2013-10-31 MED ORDER — OXYCODONE-ACETAMINOPHEN 5-325 MG PO TABS
1.0000 | ORAL_TABLET | ORAL | Status: DC | PRN
  Administered 2013-10-31 (×4): 1 via ORAL
  Administered 2013-11-01 – 2013-11-07 (×23): 2 via ORAL
  Administered 2013-11-07: 1 via ORAL
  Administered 2013-11-07 – 2013-11-09 (×6): 2 via ORAL
  Filled 2013-10-31 (×16): qty 2
  Filled 2013-10-31: qty 1
  Filled 2013-10-31 (×15): qty 2
  Filled 2013-10-31: qty 1
  Filled 2013-10-31: qty 2

## 2013-10-31 NOTE — Progress Notes (Addendum)
      [address]       [place] [id number]             [phone]      Subjective:  [name] complains of back pain.  States she has been uncomfortable all evening.  However she refused OXY IR stating it doesn't work.  She is a chronic pain medication user stating she uses Percocet and MS Contin at home.  Objective: Vital signs in last 24 hours: Temp:  [98.8 F (37.1 C)-102.4 F (39.1 C)] 98.9 F (37.2 C) (10/09 0750) Pulse Rate:  [67-94] 67 (10/09 0750) Cardiac Rhythm:  [-] Normal sinus rhythm (10/09 0300) Resp:  [12-23] 18 (10/09 0750) BP: (101-113)/(48-74) 105/52 mmHg (10/09 0750) SpO2:  [97 %-100 %] 97 % (10/09 0750) Weight:  [121 lb (54.885 kg)-127 lb 3.3 oz (57.7 kg)] 127 lb 3.3 oz (57.7 kg) (10/08 1700)  Intake/Output from previous day: 10/08 0701 - 10/09 0700 In: 1607.5 [P.O.:960; I.V.:647.5] Out: 400 [Urine:400] Intake/Output this shift: Total I/O In: 100 [I.V.:100] Out: -   General appearance: alert and no distress Heart: regular rate and rhythm Lungs: diminished breath sounds right base Abdomen: soft, non-tender; bowel sounds normal; no masses,  no organomegaly Extremities: extremities normal, atraumatic, no cyanosis or edema  Lab Results:  Recent Labs  10/28/13 1744 10/30/13 1715  WBC 8.6 8.1  HGB 9.8* 9.5*  HCT 29.5* 29.1*  PLT 439* 427*   BMET:  Recent Labs  10/28/13 1744 10/30/13 1715  NA 139 139  K 3.7 3.4*  CL 99 100  CO2 27 26  GLUCOSE 88 109*  BUN 9 9  CREATININE 0.52 0.55  CALCIUM 9.2 8.8    PT/INR: No results found for this basename: LABPROT, INR,  in the last 72 hours ABG No results found for this basename: phart, pco2, po2, hco3, tco2, acidbasedef, o2sat   CBG (last 3)  No results found for this basename: GLUCAP,  in the last 72 hours  Assessment/Plan: S/P Procedure(s) (LRB):  Possible Right VIDEO ASSISTED THORACOSCOPY, Bronchoscopy (Right) VIDEO BRONCHOSCOPY (N/A)  1. Pulmonary embolism-  patient awaiting filter placement by IR 2. 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 Chronic Pain- patient likely abuses pain medication, will order Tox screen, switch to Percocet, which patient states she uses as home 4. H/O Pelvic Fx- Ortho following   LOS: 1 day    BARRETT, ERIN 10/31/2013  Caval filter placed today, will transition from xarelto to heparin on Sunday, and plan to proceed with vats Tuesday I have seen and examined [name] [name] and agree with the above assessment  and plan.  Delight OvensEdward B Jacaden Forbush MD Beeper [phone] Office [phone] 10/31/2013 5:16 PM
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
## 2013-10-31 NOTE — Progress Notes (Signed)
Subjective: [name] is somnolent and quiet this morning, but otherwise responds appropriately and engages in conversation this morning. [name] [name] states that [name] [name] was abel to walk around normally, with some soreness to her L hip prior to her current admission. We saw her in clinic a few weeks ago and was showing signs of improvement then. [name] [name] does report some pain in her L hip today, but it is mostly with palpation and not movement. Her biggest complaints today include chest pain. [name] [name] denies any new HA, N,V, fever, chills, leg swelling. [name] [name] wants to be able to get up and walk around when [name] [name] is able to. [name] [name] was on Xarelto for DVT prophylaxis prior to admission.    Objective: Vital signs in last 24 hours: Temp:  [98.8 F (37.1 C)-102.4 F (39.1 C)] 98.8 F (37.1 C) (10/09 0300) Pulse Rate:  [68-94] 76 (10/09 0300) Resp:  [12-23] 22 (10/09 0300) BP: (101-113)/(48-74) 102/48 mmHg (10/09 0300) SpO2:  [97 %-100 %] 97 % (10/09 0300) Weight:  [54.885 kg (121 lb)-57.7 kg (127 lb 3.3 oz)] 57.7 kg (127 lb 3.3 oz) (10/08 1700)  Intake/Output from previous day: 10/08 0701 - 10/09 0700 In: 1607.5 [P.O.:960; I.V.:647.5] Out: 400 [Urine:400] Intake/Output this shift:     Recent Labs  10/28/13 1744 10/30/13 1715  HGB 9.8* 9.5*    Recent Labs  10/28/13 1744 10/30/13 1715  WBC 8.6 8.1  RBC 3.39* 3.38*  HCT 29.5* 29.1*  PLT 439* 427*    Recent Labs  10/28/13 1744 10/30/13 1715  NA 139 139  K 3.7 3.4*  CL 99 100  CO2 27 26  BUN 9 9  CREATININE 0.52 0.55  GLUCOSE 88 109*  CALCIUM 9.2 8.8   No results found for this basename: LABPT, INR,  in the last 72 hours  [name] [name] in nad, solmolent. A and O x4. Mood and affect appropriate. EOMI. Respirations normal and unlabored. Abdomen soft and non-tender. Mild tenderness to palpation or L ala of pelvis. NV intact with brisk capillary refill and 5/5 strength of toe extensors and flexors bilaterally. Distal sensation intact bilaterally. No  lymphadenopathy. No calf swelling or palpable cords.    Assessment/Plan: L non-displaced iliac wing fracture: WBAT LLE. No restrictions on activity. F/u with us as scheduled. Pain medications per CT surgery; [name] [name] was given PO tramadol 50mg  at her last d/c and was told to take Tylenol or NSAIDs prn at her last visit with us in clinic.   Pulmonary Embolism: tx per CT team; filter to be done in IR, VATS to follow next week.    [name] [name] 10/31/2013, 7:49 AM  [phone]

## 2013-10-31 NOTE — Progress Notes (Signed)
Utilization review completed.  

## 2013-10-31 NOTE — Procedures (Signed)
Successful IVC FILTER INSERTION preoperatively No comp Stable Full report in PACS

## 2013-11-01 NOTE — Progress Notes (Addendum)
      [address]       [place] [id number]             [phone]         Procedure(s) (LRB):  Possible Right VIDEO ASSISTED THORACOSCOPY, Bronchoscopy (Right) VIDEO BRONCHOSCOPY (N/A)  Subjective: Patient sleeping. I spoke with significant other. She has complaints of back and right sided arm pain/chest pain.  Objective: Vital signs in last 24 hours: Temp:  [98.2 F (36.8 C)-99.1 F (37.3 C)] 99.1 F (37.3 C) (10/10 0700) Pulse Rate:  [60-82] 75 (10/10 0304) Cardiac Rhythm:  [-] Normal sinus rhythm (10/10 0022) Resp:  [16-23] 22 (10/10 0304) BP: (95-111)/(45-71) 104/55 mmHg (10/10 0304) SpO2:  [96 %-100 %] 100 % (10/10 0304)     Intake/Output from previous day: 10/09 0701 - 10/10 0700 In: 1350 [P.O.:600; I.V.:750] Out: 300 [Urine:300]   Physical Exam:  Cardiovascular: RRR Pulmonary: Clear on left and diminished right base; no rales, wheezes, or rhonchi. Abdomen: Soft, non tender, bowel sounds present. Extremities: No lower extremity edema.    Lab Results: CBC: Recent Labs  10/30/13 1715  WBC 8.1  HGB 9.5*  HCT 29.1*  PLT 427*   BMET:  Recent Labs  10/30/13 1715  NA 139  K 3.4*  CL 100  CO2 26  GLUCOSE 109*  BUN 9  CREATININE 0.55  CALCIUM 8.8    PT/INR: No results found for this basename: LABPROT, INR,  in the last 72 hours ABG:  INR: Will add last result for INR, ABG once components are confirmed Will add last 4 CBG results once components are confirmed  Assessment/Plan:  1. CV - SR 2.  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 Needs left VATS early next week 3. Pulmonary emboism-S/p IVC filter yesterday  4. History of pelvic fracture-follow ortho recommendations  ZIMMERMAN,DONIELLE MPA-C 11/01/2013,9:10 AM  Continues to complain of pain, says she hasn't gotten pain meds yet this AM
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
## 2013-11-03 ENCOUNTER — Inpatient Hospital Stay (HOSPITAL_COMMUNITY): Payer: No Typology Code available for payment source

## 2013-11-03 LAB — CBC
HCT: 29.5 % — ABNORMAL LOW (ref 36.0–46.0)
Hemoglobin: 9.6 g/dL — ABNORMAL LOW (ref 12.0–15.0)
MCH: 27.7 pg (ref 26.0–34.0)
MCHC: 32.5 g/dL (ref 30.0–36.0)
MCV: 85 fL (ref 78.0–100.0)
Platelets: 475 10*3/uL — ABNORMAL HIGH (ref 150–400)
RBC: 3.47 MIL/uL — ABNORMAL LOW (ref 3.87–5.11)
RDW: 14.4 % (ref 11.5–15.5)
WBC: 8 10*3/uL (ref 4.0–10.5)

## 2013-11-03 LAB — BLOOD GAS, ARTERIAL
Acid-Base Excess: 0.5 mmol/L (ref 0.0–2.0)
Bicarbonate: 24.2 mEq/L — ABNORMAL HIGH (ref 20.0–24.0)
Drawn by: 313941
FIO2: 0.21 %
O2 Saturation: 96.5 %
Patient temperature: 98.6
TCO2: 25.3 mmol/L (ref 0–100)
pCO2 arterial: 36.3 mmHg (ref 35.0–45.0)
pH, Arterial: 7.44 (ref 7.350–7.450)
pO2, Arterial: 81.7 mmHg (ref 80.0–100.0)

## 2013-11-03 LAB — COMPREHENSIVE METABOLIC PANEL
ALT: 13 U/L (ref 0–35)
AST: 17 U/L (ref 0–37)
Albumin: 2.3 g/dL — ABNORMAL LOW (ref 3.5–5.2)
Alkaline Phosphatase: 90 U/L (ref 39–117)
Anion gap: 14 (ref 5–15)
BUN: 9 mg/dL (ref 6–23)
CO2: 26 mEq/L (ref 19–32)
Calcium: 9 mg/dL (ref 8.4–10.5)
Chloride: 98 mEq/L (ref 96–112)
Creatinine, Ser: 0.51 mg/dL (ref 0.50–1.10)
GFR calc Af Amer: >90 mL/min (ref >90)
GFR calc non Af Amer: >90 mL/min (ref >90)
Glucose, Bld: 128 mg/dL — ABNORMAL HIGH (ref 70–99)
Potassium: 3.7 mEq/L (ref 3.7–5.3)
Sodium: 138 mEq/L (ref 137–147)
Total Bilirubin: <0.2 mg/dL — ABNORMAL LOW (ref 0.3–1.2)
Total Protein: 8.3 g/dL (ref 6.0–8.3)

## 2013-11-03 LAB — APTT
aPTT: 48 seconds — ABNORMAL HIGH (ref 24–37)
aPTT: 57 seconds — ABNORMAL HIGH (ref 24–37)

## 2013-11-03 LAB — ABO/RH: ABO/RH(D): O NEG

## 2013-11-03 LAB — PROTIME-INR
INR: 1.39 (ref 0.00–1.49)
Prothrombin Time: 17.1 seconds — ABNORMAL HIGH (ref 11.6–15.2)

## 2013-11-03 LAB — HEPARIN LEVEL (UNFRACTIONATED)
Heparin Unfractionated: <0.1 IU/mL — ABNORMAL LOW (ref 0.30–0.70)
Heparin Unfractionated: 0.13 IU/mL — ABNORMAL LOW (ref 0.30–0.70)

## 2013-11-03 MED ORDER — HEPARIN (PORCINE) IN NACL 100-0.45 UNIT/ML-% IJ SOLN
1300.0000 [IU]/h | INTRAMUSCULAR | Status: DC
  Administered 2013-11-03: 900 [IU]/h via INTRAVENOUS
  Filled 2013-11-03 (×2): qty 250

## 2013-11-03 MED ORDER — DEXTROSE 5 % IV SOLN
1.5000 g | INTRAVENOUS | Status: DC
  Filled 2013-11-03 (×2): qty 1.5

## 2013-11-03 NOTE — Progress Notes (Addendum)
[address]       [name] [id number]             [phone]            Procedure(s) (LRB):  Possible Right VIDEO ASSISTED THORACOSCOPY, Bronchoscopy (Right) VIDEO BRONCHOSCOPY (N/A)  Subjective: Just back from radiology. Sore but breathing stable.    Objective: Vital signs in last 24 hours: Patient Vitals for the past 24 hrs:  BP Temp Temp src Pulse Resp SpO2  11/03/13 0800 - 98.8 F (37.1 C) Oral - - -  11/03/13 0313 101/51 mmHg 99.1 F (37.3 C) Oral 89 16 97 %  11/02/13 2347 107/60 mmHg 97.9 F (36.6 C) Axillary 95 18 96 %  11/02/13 2040 - - - - - 97 %  11/02/13 1957 105/63 mmHg 98.7 F (37.1 C) Oral 85 22 96 %  11/02/13 1613 - 97.4 F (36.3 C) Oral - - -  11/02/13 1602 - - - 92 18 96 %  11/02/13 1601 - - - 87 19 98 %  11/02/13 1600 90/52 mmHg - - 91 23 94 %  11/02/13 1235 - 99.1 F (37.3 C) Oral - - -  11/02/13 1227 - - - 62 13 98 %  11/02/13 1226 - - - 70 17 97 %  11/02/13 1225 93/54 mmHg - - 60 17 98 %   Current Weight  10/30/13 127 lb 3.3 oz (57.7 kg)     Intake/Output from previous day: 10/11 0701 - 10/12 0700 In: 240 [P.O.:240] Out: 1500 [Urine:1500]    PHYSICAL EXAM:  Heart: RRR Lungs: Diminished BS in bases Extremities: Warm, well perfused    Lab Results: CBC:No results found for this basename: WBC, HGB, HCT, PLT,  in the last 72 hours BMET: No results found for this basename: NA, K, CL, CO2, GLUCOSE, BUN, CREATININE, CALCIUM,  in the last 72 hours  PT/INR: No results found for this basename: LABPROT, INR,  in the last 72 hours  CXR: FINDINGS:  Cardiomediastinal silhouette is stable. Persistent right lower lobe  atelectasis or infiltrate. Probable small loculated right pleural  effusion. Small amount of fluid right minor fissure. Left lung is  clear. No pulmonary edema.  IMPRESSION:  Persistent right lower lobe atelectasis or infiltrate. Probable  small loculated right pleural effusion. No pulmonary edema.      Assessment/Plan: S/P Procedure(s) (LRB):  Possible Right VIDEO ASSISTED THORACOSCOPY, Bronchoscopy (Right) VIDEO BRONCHOSCOPY (N/A) PE- Tolerated IVC filter placement Friday.  Xarelto d/c'ed and Heparin started. Necrotic RLL - For R VATS in am.   LOS: 4 days    [name],[name] 11/03/2013  Plan bronchoscopy, right VATS decortication poss lung resection tomorrow. I have explained to patient and her mother the procedure and risks and options. The goals risks and alternatives of the planned surgical procedure bronchoscopy, right VATS decortication poss lung resection  have been discussed with the patient in detail. The risks of the procedure including death, infection, stroke, myocardial infarction, bleeding, blood transfusion have all been discussed specifically. Patient is aware the risk of recurrent PE, she has had caval filter placed. I have quoted [name] [name] a 5% of perioperative mortality and a complication rate as high as 25 %. The patient's questions have been answered.[name] [name] is willing  to proceed with the planned procedure. I have seen and examined [name] [name] and agree with the above assessment  and plan.  Delight OvensEdward B Dwanna Goshert MD [facility] [phone] Office [phone] 11/03/2013 3:01  PM   

## 2013-11-03 NOTE — Progress Notes (Signed)
Pt smoking in BR, pt educated to not smoke in BR due to safety of self and others, MD/N, pt to go to sx in am, pt offered to lock belongings up in security or for mom to take home, pt decided for mom to take belongings home

## 2013-11-03 NOTE — Progress Notes (Signed)
Utilization review completed.  
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
## 2013-11-03 NOTE — Progress Notes (Signed)
Pt Xarelto was d/cd on 30 Oct 2013

## 2013-11-03 NOTE — Progress Notes (Signed)
Pt refused new IV at current, nursing will cont to monitor, MD/N
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
## 2013-11-04 ENCOUNTER — Encounter (HOSPITAL_COMMUNITY)
Admission: AD | Disposition: A | Discharge status: Home / Self Care | Payer: Self-pay | Source: Ambulatory Visit | Attending: Cardiothoracic Surgery

## 2013-11-04 ENCOUNTER — Inpatient Hospital Stay (HOSPITAL_COMMUNITY): Payer: No Typology Code available for payment source | Admitting: Certified Registered"

## 2013-11-04 ENCOUNTER — Encounter (HOSPITAL_COMMUNITY): Payer: Self-pay | Admitting: Certified Registered"

## 2013-11-04 ENCOUNTER — Inpatient Hospital Stay (HOSPITAL_COMMUNITY): Payer: No Typology Code available for payment source

## 2013-11-04 DIAGNOSIS — J852 Abscess of lung without pneumonia: Secondary | ICD-10-CM

## 2013-11-04 DIAGNOSIS — I2699 Other pulmonary embolism without acute cor pulmonale: Secondary | ICD-10-CM

## 2013-11-04 DIAGNOSIS — J869 Pyothorax without fistula: Secondary | ICD-10-CM | POA: Diagnosis present

## 2013-11-04 DIAGNOSIS — S32392D Other fracture of left ilium, subsequent encounter for fracture with routine healing: Secondary | ICD-10-CM

## 2013-11-04 HISTORY — PX: VIDEO BRONCHOSCOPY: SHX5072

## 2013-11-04 HISTORY — PX: VIDEO ASSISTED THORACOSCOPY (VATS)/THOROCOTOMY: SHX6173

## 2013-11-04 LAB — CBC
HCT: 27.1 % — ABNORMAL LOW (ref 36.0–46.0)
Hemoglobin: 9 g/dL — ABNORMAL LOW (ref 12.0–15.0)
MCH: 28 pg (ref 26.0–34.0)
MCHC: 33.2 g/dL (ref 30.0–36.0)
MCV: 84.2 fL (ref 78.0–100.0)
Platelets: 448 10*3/uL — ABNORMAL HIGH (ref 150–400)
RBC: 3.22 MIL/uL — ABNORMAL LOW (ref 3.87–5.11)
RDW: 14.3 % (ref 11.5–15.5)
WBC: 11.3 10*3/uL — ABNORMAL HIGH (ref 4.0–10.5)

## 2013-11-04 SURGERY — BRONCHOSCOPY, VIDEO-ASSISTED
Anesthesia: General | Site: Chest | Laterality: Right

## 2013-11-04 MED ORDER — LACTATED RINGERS IV SOLN
INTRAVENOUS | Status: DC
  Administered 2013-11-04: 09:00:00 via INTRAVENOUS

## 2013-11-04 MED ORDER — VANCOMYCIN HCL IN DEXTROSE 1-5 GM/200ML-% IV SOLN
1000.0000 mg | Freq: Once | INTRAVENOUS | Status: AC
  Administered 2013-11-04: 1000 mg via INTRAVENOUS
  Filled 2013-11-04: qty 200

## 2013-11-04 MED ORDER — ACETAMINOPHEN 160 MG/5ML PO SOLN
1000.0000 mg | Freq: Four times a day (QID) | ORAL | Status: DC
  Filled 2013-11-04: qty 40

## 2013-11-04 MED ORDER — DIPHENHYDRAMINE HCL 50 MG/ML IJ SOLN
12.5000 mg | Freq: Four times a day (QID) | INTRAMUSCULAR | Status: DC | PRN
  Filled 2013-11-04: qty 0.25

## 2013-11-04 MED ORDER — MIDAZOLAM HCL 2 MG/2ML IJ SOLN
INTRAMUSCULAR | Status: AC
  Filled 2013-11-04: qty 2

## 2013-11-04 MED ORDER — ROCURONIUM BROMIDE 50 MG/5ML IV SOLN
INTRAVENOUS | Status: AC
  Filled 2013-11-04: qty 1

## 2013-11-04 MED ORDER — FENTANYL CITRATE 0.05 MG/ML IJ SOLN
INTRAMUSCULAR | Status: AC
  Filled 2013-11-04: qty 5

## 2013-11-04 MED ORDER — ONDANSETRON HCL 4 MG/2ML IJ SOLN: 4.0000 mg | Freq: Four times a day (QID) | INTRAMUSCULAR | Status: DC | PRN

## 2013-11-04 MED ORDER — ONDANSETRON HCL 4 MG/2ML IJ SOLN
4.0000 mg | Freq: Four times a day (QID) | INTRAMUSCULAR | Status: DC | PRN
  Administered 2013-11-05: 4 mg via INTRAVENOUS
  Filled 2013-11-04 (×2): qty 2

## 2013-11-04 MED ORDER — ACETAMINOPHEN 500 MG PO TABS
1000.0000 mg | ORAL_TABLET | Freq: Four times a day (QID) | ORAL | Status: DC
  Administered 2013-11-04 – 2013-11-06 (×7): 1000 mg via ORAL
  Filled 2013-11-04 (×10): qty 2

## 2013-11-04 MED ORDER — PROPOFOL 10 MG/ML IV BOLUS
INTRAVENOUS | Status: AC
  Filled 2013-11-04: qty 20

## 2013-11-04 MED ORDER — HYDROMORPHONE HCL 1 MG/ML IJ SOLN: 0.5000 mg | INTRAMUSCULAR | Status: DC | PRN

## 2013-11-04 MED ORDER — DEXTROSE 5 % IV SOLN
1.5000 g | INTRAVENOUS | Status: AC
  Administered 2013-11-04: 1.5 g via INTRAVENOUS
  Filled 2013-11-04: qty 1.5

## 2013-11-04 MED ORDER — LORAZEPAM 2 MG/ML IJ SOLN
1.0000 mg | Freq: Once | INTRAMUSCULAR | Status: AC | PRN
  Administered 2013-11-04: 1 mg via INTRAVENOUS

## 2013-11-04 MED ORDER — HYDROMORPHONE HCL 1 MG/ML IJ SOLN
INTRAMUSCULAR | Status: DC | PRN
  Administered 2013-11-04 (×2): 0.5 mg via INTRAVENOUS

## 2013-11-04 MED ORDER — HYDROMORPHONE HCL 1 MG/ML IJ SOLN
INTRAMUSCULAR | Status: AC
  Filled 2013-11-04: qty 1

## 2013-11-04 MED ORDER — PIPERACILLIN-TAZOBACTAM 3.375 G IVPB
3.3750 g | Freq: Three times a day (TID) | INTRAVENOUS | Status: DC
  Administered 2013-11-04 – 2013-11-07 (×9): 3.375 g via INTRAVENOUS
  Filled 2013-11-04 (×11): qty 50

## 2013-11-04 MED ORDER — DIPHENHYDRAMINE HCL 12.5 MG/5ML PO ELIX
12.5000 mg | ORAL_SOLUTION | Freq: Four times a day (QID) | ORAL | Status: DC | PRN
  Filled 2013-11-04: qty 5

## 2013-11-04 MED ORDER — VANCOMYCIN HCL 500 MG IV SOLR
500.0000 mg | Freq: Three times a day (TID) | INTRAVENOUS | Status: DC
  Administered 2013-11-05 – 2013-11-06 (×5): 500 mg via INTRAVENOUS
  Filled 2013-11-04 (×7): qty 500

## 2013-11-04 MED ORDER — BISACODYL 5 MG PO TBEC
10.0000 mg | DELAYED_RELEASE_TABLET | Freq: Every day | ORAL | Status: DC
  Administered 2013-11-05 – 2013-11-07 (×3): 10 mg via ORAL
  Filled 2013-11-04 (×3): qty 2

## 2013-11-04 MED ORDER — LIDOCAINE HCL (CARDIAC) 20 MG/ML IV SOLN
INTRAVENOUS | Status: DC | PRN
  Administered 2013-11-04: 80 mg via INTRAVENOUS

## 2013-11-04 MED ORDER — LACTATED RINGERS IV SOLN
INTRAVENOUS | Status: DC | PRN
  Administered 2013-11-04 (×2): via INTRAVENOUS

## 2013-11-04 MED ORDER — EPINEPHRINE HCL 1 MG/ML IJ SOLN
INTRAMUSCULAR | Status: AC
  Filled 2013-11-04: qty 1

## 2013-11-04 MED ORDER — PROPOFOL 10 MG/ML IV BOLUS
INTRAVENOUS | Status: DC | PRN
  Administered 2013-11-04: 150 mg via INTRAVENOUS

## 2013-11-04 MED ORDER — LORAZEPAM 2 MG/ML IJ SOLN
INTRAMUSCULAR | Status: AC
  Filled 2013-11-04: qty 1

## 2013-11-04 MED ORDER — NALOXONE HCL 0.4 MG/ML IJ SOLN
0.4000 mg | INTRAMUSCULAR | Status: DC | PRN
  Filled 2013-11-04: qty 1

## 2013-11-04 MED ORDER — METOCLOPRAMIDE HCL 5 MG/ML IJ SOLN
10.0000 mg | Freq: Four times a day (QID) | INTRAMUSCULAR | Status: AC
  Administered 2013-11-04 – 2013-11-05 (×4): 10 mg via INTRAVENOUS
  Filled 2013-11-04 (×5): qty 2

## 2013-11-04 MED ORDER — DEXTROSE-NACL 5-0.45 % IV SOLN
INTRAVENOUS | Status: DC
  Administered 2013-11-04 – 2013-11-05 (×2): via INTRAVENOUS

## 2013-11-04 MED ORDER — FENTANYL CITRATE 0.05 MG/ML IJ SOLN
INTRAMUSCULAR | Status: DC | PRN
  Administered 2013-11-04 (×12): 50 ug via INTRAVENOUS
  Administered 2013-11-04: 100 ug via INTRAVENOUS
  Administered 2013-11-04: 50 ug via INTRAVENOUS

## 2013-11-04 MED ORDER — OXYCODONE HCL 5 MG PO TABS: 5.0000 mg | ORAL_TABLET | Freq: Once | ORAL | Status: DC | PRN

## 2013-11-04 MED ORDER — NEOSTIGMINE METHYLSULFATE 10 MG/10ML IV SOLN
INTRAVENOUS | Status: DC | PRN
  Administered 2013-11-04: 3 mg via INTRAVENOUS

## 2013-11-04 MED ORDER — HYDROMORPHONE HCL 1 MG/ML IJ SOLN
0.2500 mg | INTRAMUSCULAR | Status: DC | PRN
  Administered 2013-11-04 (×3): 0.5 mg via INTRAVENOUS

## 2013-11-04 MED ORDER — SODIUM CHLORIDE 0.9 % IJ SOLN
9.0000 mL | INTRAMUSCULAR | Status: DC | PRN
  Administered 2013-11-06: 9 mL via INTRAVENOUS

## 2013-11-04 MED ORDER — ONDANSETRON HCL 4 MG/2ML IJ SOLN
INTRAMUSCULAR | Status: DC | PRN
  Administered 2013-11-04: 4 mg via INTRAVENOUS

## 2013-11-04 MED ORDER — ROCURONIUM BROMIDE 100 MG/10ML IV SOLN
INTRAVENOUS | Status: DC | PRN
  Administered 2013-11-04: 50 mg via INTRAVENOUS
  Administered 2013-11-04 (×3): 20 mg via INTRAVENOUS
  Administered 2013-11-04: 30 mg via INTRAVENOUS

## 2013-11-04 MED ORDER — KETOROLAC TROMETHAMINE 15 MG/ML IJ SOLN
15.0000 mg | Freq: Four times a day (QID) | INTRAMUSCULAR | Status: DC | PRN
  Administered 2013-11-04 – 2013-11-08 (×6): 15 mg via INTRAVENOUS
  Filled 2013-11-04 (×8): qty 1

## 2013-11-04 MED ORDER — PHENYLEPHRINE HCL 10 MG/ML IJ SOLN
10.0000 mg | INTRAMUSCULAR | Status: DC | PRN
  Administered 2013-11-04: 10 ug/min via INTRAVENOUS

## 2013-11-04 MED ORDER — MIDAZOLAM HCL 5 MG/5ML IJ SOLN
INTRAMUSCULAR | Status: DC | PRN
  Administered 2013-11-04 (×2): 2 mg via INTRAVENOUS

## 2013-11-04 MED ORDER — POTASSIUM CHLORIDE 10 MEQ/50ML IV SOLN
10.0000 meq | Freq: Every day | INTRAVENOUS | Status: DC | PRN
  Filled 2013-11-04: qty 50

## 2013-11-04 MED ORDER — SENNOSIDES-DOCUSATE SODIUM 8.6-50 MG PO TABS
1.0000 | ORAL_TABLET | Freq: Every day | ORAL | Status: DC
  Administered 2013-11-06 – 2013-11-08 (×3): 1 via ORAL
  Filled 2013-11-04 (×6): qty 1

## 2013-11-04 MED ORDER — OXYCODONE HCL 5 MG/5ML PO SOLN: 5.0000 mg | Freq: Once | ORAL | Status: DC | PRN

## 2013-11-04 MED ORDER — GLYCOPYRROLATE 0.2 MG/ML IJ SOLN
INTRAMUSCULAR | Status: DC | PRN
  Administered 2013-11-04: 0.4 mg via INTRAVENOUS

## 2013-11-04 MED ORDER — 0.9 % SODIUM CHLORIDE (POUR BTL) OPTIME
TOPICAL | Status: DC | PRN
  Administered 2013-11-04 (×2): 1000 mL

## 2013-11-04 MED ORDER — KETOROLAC TROMETHAMINE 30 MG/ML IJ SOLN
INTRAMUSCULAR | Status: DC | PRN
  Administered 2013-11-04: 30 mg via INTRAVENOUS

## 2013-11-04 MED ORDER — METOPROLOL TARTRATE 1 MG/ML IV SOLN
INTRAVENOUS | Status: DC | PRN
  Administered 2013-11-04: 2.5 mg via INTRAVENOUS

## 2013-11-04 MED ORDER — LORAZEPAM 2 MG/ML IJ SOLN: 1.0000 mg | Freq: Once | INTRAMUSCULAR | Status: DC

## 2013-11-04 MED ORDER — MIDAZOLAM HCL 5 MG/ML IJ SOLN: 1.0000 mg | Freq: Once | INTRAMUSCULAR | Status: DC

## 2013-11-04 MED ORDER — ARTIFICIAL TEARS OP OINT
TOPICAL_OINTMENT | OPHTHALMIC | Status: DC | PRN
  Administered 2013-11-04: 1 via OPHTHALMIC

## 2013-11-04 MED ORDER — SUCCINYLCHOLINE CHLORIDE 20 MG/ML IJ SOLN
INTRAMUSCULAR | Status: AC
  Filled 2013-11-04: qty 1

## 2013-11-04 MED ORDER — FENTANYL CITRATE 0.05 MG/ML IJ SOLN: 100.0000 ug | Freq: Once | INTRAMUSCULAR | Status: DC

## 2013-11-04 MED ORDER — FENTANYL 10 MCG/ML IV SOLN
INTRAVENOUS | Status: DC
  Administered 2013-11-04: 75 ug via INTRAVENOUS
  Administered 2013-11-04: 15:00:00 via INTRAVENOUS
  Administered 2013-11-04: 150 ug via INTRAVENOUS
  Administered 2013-11-04: 240 ug via INTRAVENOUS
  Administered 2013-11-05: 17:00:00 via INTRAVENOUS
  Administered 2013-11-05: 255 ug via INTRAVENOUS
  Administered 2013-11-05: 300 ug via INTRAVENOUS
  Administered 2013-11-05: 225 ug via INTRAVENOUS
  Administered 2013-11-05: 08:00:00 via INTRAVENOUS
  Administered 2013-11-05: 180 ug via INTRAVENOUS
  Administered 2013-11-06: 12:00:00 via INTRAVENOUS
  Administered 2013-11-06: 240 ug via INTRAVENOUS
  Administered 2013-11-06: 219.4 ug via INTRAVENOUS
  Administered 2013-11-06: 180 ug via INTRAVENOUS
  Administered 2013-11-06: 75 ug via INTRAVENOUS
  Administered 2013-11-06: 161.7 ug via INTRAVENOUS
  Administered 2013-11-06: 22:00:00 via INTRAVENOUS
  Administered 2013-11-06: 90 ug via INTRAVENOUS
  Administered 2013-11-06: 105 ug via INTRAVENOUS
  Administered 2013-11-07: 195 ug via INTRAVENOUS
  Administered 2013-11-07: 135 ug via INTRAVENOUS
  Administered 2013-11-07: 240 ug via INTRAVENOUS
  Administered 2013-11-07: 20:00:00 via INTRAVENOUS
  Administered 2013-11-07: 150 ug via INTRAVENOUS
  Administered 2013-11-07: 56.52 ug via INTRAVENOUS
  Administered 2013-11-07: 09:00:00 via INTRAVENOUS
  Administered 2013-11-08: 75 ug via INTRAVENOUS
  Administered 2013-11-08: 105 ug via INTRAVENOUS
  Administered 2013-11-08: 120 ug via INTRAVENOUS
  Administered 2013-11-08: 210 ug via INTRAVENOUS
  Filled 2013-11-04 (×10): qty 50

## 2013-11-04 MED ORDER — FENTANYL CITRATE 0.05 MG/ML IJ SOLN
INTRAMUSCULAR | Status: AC
Start: 2013-11-04 — End: 2013-11-04
  Filled 2013-11-04: qty 5

## 2013-11-04 SURGICAL SUPPLY — 79 items
APPLICATOR TIP COSEAL (VASCULAR PRODUCTS) IMPLANT
APPLICATOR TIP EXT COSEAL (VASCULAR PRODUCTS) IMPLANT
BLADE SURG 11 STRL SS (BLADE) IMPLANT
BRUSH CYTOL CELLEBRITY 1.5X140 (MISCELLANEOUS) IMPLANT
CANISTER SUCTION 2500CC (MISCELLANEOUS) ×3 IMPLANT
CATH KIT ON Q 5IN SLV (PAIN MANAGEMENT) IMPLANT
CATH THORACIC 28FR (CATHETERS) IMPLANT
CATH THORACIC 36FR (CATHETERS) IMPLANT
CATH THORACIC 36FR RT ANG (CATHETERS) IMPLANT
CLEANER TIP ELECTROSURG 2X2 (MISCELLANEOUS) IMPLANT
CLIP TI MEDIUM 6 (CLIP) ×3 IMPLANT
CONN ST 1/4X3/8  BEN (MISCELLANEOUS) ×2
CONN ST 1/4X3/8 BEN (MISCELLANEOUS) ×4 IMPLANT
CONT SPEC 4OZ CLIKSEAL STRL BL (MISCELLANEOUS) ×6 IMPLANT
COVER TABLE BACK 60X90 (DRAPES) IMPLANT
DERMABOND ADVANCED (GAUZE/BANDAGES/DRESSINGS) ×1
DERMABOND ADVANCED .7 DNX12 (GAUZE/BANDAGES/DRESSINGS) ×2 IMPLANT
DRAIN CHANNEL 28F RND 3/8 FF (WOUND CARE) ×3 IMPLANT
DRAPE LAPAROSCOPIC ABDOMINAL (DRAPES) ×3 IMPLANT
DRAPE WARM FLUID 44X44 (DRAPE) ×3 IMPLANT
DRILL BIT 7/64X5 (BIT) IMPLANT
ELECT BLADE 4.0 EZ CLEAN MEGAD (MISCELLANEOUS) ×3
ELECT REM PT RETURN 9FT ADLT (ELECTROSURGICAL) ×3
ELECTRODE BLDE 4.0 EZ CLN MEGD (MISCELLANEOUS) ×2 IMPLANT
ELECTRODE REM PT RTRN 9FT ADLT (ELECTROSURGICAL) ×2 IMPLANT
FORCEPS BIOP RJ4 1.8 (CUTTING FORCEPS) IMPLANT
GAUZE SPONGE 4X4 12PLY STRL (GAUZE/BANDAGES/DRESSINGS) ×3 IMPLANT
GLOVE BIO SURGEON STRL SZ 6.5 (GLOVE) ×9 IMPLANT
GLOVE BIOGEL PI IND STRL 7.0 (GLOVE) ×4 IMPLANT
GLOVE BIOGEL PI INDICATOR 7.0 (GLOVE) ×2
GLOVE SURG SS PI 7.0 STRL IVOR (GLOVE) ×6 IMPLANT
GOWN STRL REUS W/ TWL LRG LVL3 (GOWN DISPOSABLE) ×4 IMPLANT
GOWN STRL REUS W/ TWL XL LVL3 (GOWN DISPOSABLE) ×6 IMPLANT
GOWN STRL REUS W/TWL LRG LVL3 (GOWN DISPOSABLE) ×2
GOWN STRL REUS W/TWL XL LVL3 (GOWN DISPOSABLE) ×3
KIT BASIN OR (CUSTOM PROCEDURE TRAY) ×3 IMPLANT
KIT ROOM TURNOVER OR (KITS) ×3 IMPLANT
KIT SUCTION CATH 14FR (SUCTIONS) IMPLANT
MARKER SKIN DUAL TIP RULER LAB (MISCELLANEOUS) IMPLANT
NEEDLE BIOPSY TRANSBRONCH 21G (NEEDLE) IMPLANT
NS IRRIG 1000ML POUR BTL (IV SOLUTION) ×6 IMPLANT
OIL SILICONE PENTAX (PARTS (SERVICE/REPAIRS)) ×3 IMPLANT
PACK CHEST (CUSTOM PROCEDURE TRAY) ×3 IMPLANT
PAD ARMBOARD 7.5X6 YLW CONV (MISCELLANEOUS) ×9 IMPLANT
PASSER SUT SWANSON 36MM LOOP (INSTRUMENTS) ×3 IMPLANT
SEALANT PROGEL (MISCELLANEOUS) IMPLANT
SEALANT SURG COSEAL 4ML (VASCULAR PRODUCTS) IMPLANT
SEALANT SURG COSEAL 8ML (VASCULAR PRODUCTS) IMPLANT
SOLUTION ANTI FOG 6CC (MISCELLANEOUS) ×3 IMPLANT
SPONGE GAUZE 4X4 12PLY STER LF (GAUZE/BANDAGES/DRESSINGS) ×3 IMPLANT
SUT PROLENE 3 0 SH DA (SUTURE) IMPLANT
SUT PROLENE 4 0 RB 1 (SUTURE)
SUT PROLENE 4-0 RB1 .5 CRCL 36 (SUTURE) IMPLANT
SUT SILK  1 MH (SUTURE) ×2
SUT SILK 1 MH (SUTURE) ×4 IMPLANT
SUT SILK 2 0SH CR/8 30 (SUTURE) IMPLANT
SUT SILK 3 0SH CR/8 30 (SUTURE) IMPLANT
SUT VIC AB 1 CTX 18 (SUTURE) ×3 IMPLANT
SUT VIC AB 1 CTX 36 (SUTURE)
SUT VIC AB 1 CTX36XBRD ANBCTR (SUTURE) IMPLANT
SUT VIC AB 2-0 CTX 36 (SUTURE) IMPLANT
SUT VIC AB 2-0 SH 18 (SUTURE) ×3 IMPLANT
SUT VIC AB 3-0 SH 18 (SUTURE) ×3 IMPLANT
SUT VIC AB 3-0 X1 27 (SUTURE) ×3 IMPLANT
SUT VICRYL 2 TP 1 (SUTURE) ×3 IMPLANT
SWAB COLLECTION DEVICE MRSA (MISCELLANEOUS) IMPLANT
SYR 20ML ECCENTRIC (SYRINGE) ×3 IMPLANT
SYSTEM SAHARA CHEST DRAIN ATS (WOUND CARE) ×3 IMPLANT
TAPE CLOTH 4X10 WHT NS (GAUZE/BANDAGES/DRESSINGS) ×3 IMPLANT
TAPE CLOTH SURG 4X10 WHT LF (GAUZE/BANDAGES/DRESSINGS) ×3 IMPLANT
TIP APPLICATOR SPRAY EXTEND 16 (VASCULAR PRODUCTS) IMPLANT
TOWEL OR 17X24 6PK STRL BLUE (TOWEL DISPOSABLE) ×3 IMPLANT
TOWEL OR 17X26 10 PK STRL BLUE (TOWEL DISPOSABLE) ×6 IMPLANT
TRAP SPECIMEN MUCOUS 40CC (MISCELLANEOUS) ×3 IMPLANT
TRAY FOLEY CATH 14FRSI W/METER (CATHETERS) ×3 IMPLANT
TROCAR XCEL BLUNT TIP 100MML (ENDOMECHANICALS) IMPLANT
TUBE ANAEROBIC SPECIMEN COL (MISCELLANEOUS) IMPLANT
TUBE CONNECTING 12X1/4 (SUCTIONS) ×6 IMPLANT
WATER STERILE IRR 1000ML POUR (IV SOLUTION) ×3 IMPLANT

## 2013-11-04 NOTE — Brief Op Note (Addendum)
      [address]       [place] [id number]             [phone]     10/30/2013 - 11/04/2013  12:48 PM  PATIENT:  [name] [name]  28 y.o. female  PRE-OPERATIVE DIAGNOSIS:  Pulmonary infarction,right empyema, pulmonary embolism  POST-OPERATIVE DIAGNOSIS:  Empyema with possible Anaerobic Necrotic Lung Abscess  PROCEDURE:  Procedure(s):  VIDEO BRONCHOSCOPY (N/A)  VIDEO ASSISTED THORACOSCOPY  -Mini Thoracotomy -Drainage of Pleural Effusion -Decortication of Empyema  SURGEON:  Surgeon(s) and Role:    * Delight OvensEdward B Jr Milliron, MD - Primary  PHYSICIAN ASSISTANT: Erin Barrett PA-C  ANESTHESIA:   general  EBL:  Total I/O In: 1500 [I.V.:1500] Out: 455 [Urine:155; Blood:300]  BLOOD ADMINISTERED:none  DRAINS: 28 Blake Drains x 2   LOCAL MEDICATIONS USED:  NONE  SPECIMEN:  Source of Specimen:  Pleural Fluid, Necrotic Lung, Empyema Peel  DISPOSITION OF SPECIMEN:  Microbiology, Histology  COUNTS:  YES   DICTATION: .Dragon Dictation  PLAN OF CARE: Admit to inpatient   PATIENT DISPOSITION:  PACU - hemodynamically stable.   Delay start of Pharmacological VTE agent (>24hrs) due to surgical blood loss or risk of bleeding: yes

## 2013-11-04 NOTE — Anesthesia Preprocedure Evaluation (Addendum)
Anesthesia Evaluation  Patient identified by MRN, date of birth, ID band Patient awake    Reviewed: Allergy & Precautions, H&P , NPO status , Patient's Chart, lab work & pertinent test results, reviewed documented beta blocker date and time   Airway Mallampati: II TM Distance: >3 FB     Dental  (+) Missing, Teeth Intact   Pulmonary asthma , Current Smoker,  S/p b/l rib fractures chest tub pe with necrotic lung and right effusion, pain with deep inspiration breath sounds clear to auscultation        Cardiovascular negative cardio ROS  Rhythm:Regular     Neuro/Psych Anxiety Depression    GI/Hepatic negative GI ROS, Neg liver ROS,   Endo/Other  negative endocrine ROS  Renal/GU negative Renal ROS     Musculoskeletal   Abdominal   Peds  Hematology  (+) anemia , ivc filter, h/o of pe   Anesthesia Other Findings   Reproductive/Obstetrics                          Anesthesia Physical Anesthesia Plan  ASA: III  Anesthesia Plan: General   Post-op Pain Management:    Induction: Intravenous  Airway Management Planned: Double Lumen EBT  Additional Equipment: Arterial line, None and CVP  Intra-op Plan:   Post-operative Plan: Extubation in OR  Informed Consent: I have reviewed the patients History and Physical, chart, labs and discussed the procedure including the risks, benefits and alternatives for the proposed anesthesia with the patient or authorized representative who has indicated his/her understanding and acceptance.   Dental advisory given  Plan Discussed with: CRNA and Surgeon  Anesthesia Plan Comments: (Discussed epidural if convert to thoracotomy )        Anesthesia Quick Evaluation

## 2013-11-04 NOTE — OR Nursing (Signed)
Incorrect count on procedure. X-ray taken. Radiologist Dr. Jena GaussMaxwell confirmed no retained objects.

## 2013-11-04 NOTE — Progress Notes (Signed)
Utilization review completed.  

## 2013-11-04 NOTE — Anesthesia Postprocedure Evaluation (Signed)
  Anesthesia Post-op Note  Patient: [name] [name]  Procedure(s) Performed: Procedure(s): VIDEO BRONCHOSCOPY (N/A) RIGHT VIDEO ASSISTED THORACOSCOPY (VATS)/ RIGHT THOROCOTOMY (Right)  Patient Location: PACU  Anesthesia Type:General  Level of Consciousness: awake  Airway and Oxygen Therapy: Patient Spontanous Breathing and Patient connected to nasal cannula oxygen  Post-op Pain: moderate  Post-op Assessment: Post-op Vital signs reviewed, Patient's Cardiovascular Status Stable, Respiratory Function Stable, Patent Airway, No signs of Nausea or vomiting and Pain level controlled  Post-op Vital Signs: Reviewed and stable  Last Vitals:  Filed Vitals:   11/04/13 1600  BP: 124/75  Pulse: 88  Temp: 37.9 C  Resp: 26    Complications: No apparent anesthesia complications

## 2013-11-04 NOTE — Progress Notes (Signed)
[facility] BRIEF SICU PROGRESS NOTE  Day of Surgery  S/P Procedure(s) (LRB): VIDEO BRONCHOSCOPY (N/A) RIGHT VIDEO ASSISTED THORACOSCOPY (VATS)/ RIGHT THOROCOTOMY (Right)   Adequate analgesia Breathing comfortably O2 sats 96-100% on 2 L/min NSR w/ stable BP UOP adequate  Plan: Continue routine early postop  OWEN,CLARENCE H 11/04/2013 5:46 PM
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
## 2013-11-04 NOTE — Transfer of Care (Signed)
Immediate Anesthesia Transfer of Care Note  Patient: [name] [name]  Procedure(s) Performed: Procedure(s): VIDEO BRONCHOSCOPY (N/A) RIGHT VIDEO ASSISTED THORACOSCOPY (VATS)/ RIGHT THOROCOTOMY (Right)  Patient Location: PACU  Anesthesia Type:General  Level of Consciousness: awake, alert  and oriented  Airway & Oxygen Therapy: Patient connected to face mask oxygen  Post-op Assessment: Report given to PACU RN  Post vital signs: stable  Complications: No apparent anesthesia complications

## 2013-11-04 NOTE — Anesthesia Procedure Notes (Addendum)
Procedure Name: Intubation Date/Time: 11/04/2013 10:00 AM Performed by: Ellin GoodieWEAVER, HOLLY M Pre-anesthesia Checklist: Patient identified, Emergency Drugs available, Suction available, Patient being monitored and Timeout performed Patient Re-evaluated:Patient Re-evaluated prior to inductionOxygen Delivery Method: Circle system utilized Preoxygenation: Pre-oxygenation with 100% oxygen Intubation Type: IV induction Ventilation: Mask ventilation without difficulty Laryngoscope Size: Mac and 3 Grade View: Grade I Tube size: 8.5 mm Number of attempts: 1 Airway Equipment and Method: Stylet Placement Confirmation: ETT inserted through vocal cords under direct vision,  positive ETCO2 and breath sounds checked- equal and bilateral Secured at: 22 cm Tube secured with: Tape Dental Injury: Teeth and Oropharynx as per pre-operative assessment     Procedure Name: Intubation Date/Time: 11/04/2013 10:40 AM Performed by: Ellin GoodieWEAVER, HOLLY M Pre-anesthesia Checklist: Patient identified, Emergency Drugs available, Suction available, Patient being monitored and Timeout performed Patient Re-evaluated:Patient Re-evaluated prior to inductionOxygen Delivery Method: Circle system utilized Preoxygenation: Pre-oxygenation with 100% oxygen Intubation Type: IV induction Ventilation: Mask ventilation without difficulty Laryngoscope Size: Mac and 3 Tube type: Oral Endobronchial tube: EBT position confirmed by fiberoptic bronchoscope and Left and 37 Fr Number of attempts: 1 Placement Confirmation: ETT inserted through vocal cords under direct vision,  positive ETCO2 and breath sounds checked- equal and bilateral Tube secured with: Tape Dental Injury: Teeth and Oropharynx as per pre-operative assessment
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
## 2013-11-05 ENCOUNTER — Inpatient Hospital Stay (HOSPITAL_COMMUNITY): Payer: No Typology Code available for payment source

## 2013-11-05 ENCOUNTER — Encounter (HOSPITAL_COMMUNITY): Payer: Self-pay | Admitting: Cardiothoracic Surgery

## 2013-11-05 LAB — BASIC METABOLIC PANEL
Anion gap: 14 (ref 5–15)
BUN: 5 mg/dL — AB (ref 6–23)
CHLORIDE: 102 meq/L (ref 96–112)
CO2: 23 mEq/L (ref 19–32)
Calcium: 8.5 mg/dL (ref 8.4–10.5)
Creatinine, Ser: 0.54 mg/dL (ref 0.50–1.10)
GFR calc Af Amer: >90 mL/min (ref >90)
GFR calc non Af Amer: >90 mL/min (ref >90)
GLUCOSE: 115 mg/dL — AB (ref 70–99)
Potassium: 3.9 mEq/L (ref 3.7–5.3)
Sodium: 139 mEq/L (ref 137–147)

## 2013-11-05 LAB — POCT I-STAT 3, ART BLOOD GAS (G3+)
Acid-Base Excess: 3 mmol/L — ABNORMAL HIGH (ref 0.0–2.0)
Bicarbonate: 26.1 mEq/L — ABNORMAL HIGH (ref 20.0–24.0)
O2 Saturation: 95 %
Patient temperature: 100.4
TCO2: 27 mmol/L (ref 0–100)
pCO2 arterial: 36 mmHg (ref 35.0–45.0)
pH, Arterial: 7.471 — ABNORMAL HIGH (ref 7.350–7.450)
pO2, Arterial: 76 mmHg — ABNORMAL LOW (ref 80.0–100.0)

## 2013-11-05 LAB — CBC
HEMATOCRIT: 25.3 % — AB (ref 36.0–46.0)
Hemoglobin: 8.5 g/dL — ABNORMAL LOW (ref 12.0–15.0)
MCH: 28.1 pg (ref 26.0–34.0)
MCHC: 33.6 g/dL (ref 30.0–36.0)
MCV: 83.8 fL (ref 78.0–100.0)
Platelets: 442 10*3/uL — ABNORMAL HIGH (ref 150–400)
RBC: 3.02 MIL/uL — ABNORMAL LOW (ref 3.87–5.11)
RDW: 14.5 % (ref 11.5–15.5)
WBC: 8.7 10*3/uL (ref 4.0–10.5)

## 2013-11-05 LAB — DRUG SCREEN PANEL (SERUM)

## 2013-11-05 LAB — HEPATITIS PANEL, ACUTE
HCV Ab: NEGATIVE
Hep A IgM: NONREACTIVE
Hep B C IgM: NONREACTIVE
Hepatitis B Surface Ag: NEGATIVE

## 2013-11-05 LAB — HIV ANTIBODY (ROUTINE TESTING W REFLEX): HIV: NONREACTIVE

## 2013-11-05 MED ORDER — ENOXAPARIN SODIUM 30 MG/0.3ML ~~LOC~~ SOLN
30.0000 mg | SUBCUTANEOUS | Status: DC
  Administered 2013-11-05: 30 mg via SUBCUTANEOUS
  Filled 2013-11-05 (×2): qty 0.3

## 2013-11-05 NOTE — Progress Notes (Signed)
[facility]  Date of Admission:  10/30/2013  Antibiotics: Vancomycin and zosyn  Subjective: Complains of pain, discomfort from chest tubes  Objective: Temp:  [96.7 F (35.9 C)-102.8 F (39.3 C)] 99.2 F (37.3 C) (10/14 0746) Pulse Rate:  [58-109] 93 (10/14 0800) Resp:  [12-31] 28 (10/14 0800) BP: (101-124)/(56-75) 111/75 mmHg (10/14 0800) SpO2:  [94 %-100 %] 96 % (10/14 0800) Arterial Line BP: (82-166)/(50-68) 114/65 mmHg (10/14 0800) FiO2 (%):  [21 %] 21 % (10/14 0739) Weight:  [112 lb 7 oz (51 kg)] 112 lb 7 oz (51 kg) (10/14 0600)  General: awake, in chair, nad Skin: no rashes Lungs: Ant CTA Cor: RRR   Lab Results Lab Results  Component Value Date   WBC 8.7 11/05/2013   HGB 8.5* 11/05/2013   HCT 25.3* 11/05/2013   MCV 83.8 11/05/2013   PLT 442* 11/05/2013    Lab Results  Component Value Date   CREATININE 0.54 11/05/2013   BUN 5* 11/05/2013   NA 139 11/05/2013   K 3.9 11/05/2013   CL 102 11/05/2013   CO2 23 11/05/2013    Lab Results  Component Value Date   ALT 13 11/03/2013   AST 17 11/03/2013   ALKPHOS 90 11/03/2013   BILITOT <0.2* 11/03/2013      Microbiology: Recent Results (from the past 240 hour(s))  MRSA PCR SCREENING     Status: None   Collection Time    10/30/13  2:26 PM      Result Value Ref Range Status   MRSA by PCR NEGATIVE  NEGATIVE Final   Comment:            The GeneXpert MRSA Assay (FDA     approved for NASAL specimens     only), is one component of a     comprehensive MRSA colonization     surveillance program. It is not     intended to diagnose MRSA     infection nor to guide or     monitor treatment for     MRSA infections.  CULTURE, RESPIRATORY (NON-EXPECTORATED)     Status: None   Collection Time    11/04/13 10:38 AM      Result Value Ref Range Status   Specimen Description BRONCHIAL WASHINGS   Final   Special Requests PT ON ZINACEF NO 1   Final   Gram Stain     Final   Value: FEW WBC  PRESENT,BOTH PMN AND MONONUCLEAR     NO SQUAMOUS EPITHELIAL CELLS SEEN     NO ORGANISMS SEEN     Performed at [facility]   Culture     Final   Value: Culture reincubated for better growth     Performed at [facility]   Report Status PENDING   Incomplete  BODY FLUID CULTURE     Status: None   Collection Time    11/04/13 11:38 AM      Result Value Ref Range Status   Specimen Description FLUID PLEURAL RIGHT   Final   Special Requests PT ON ZINACEF NO 2   Final   Gram Stain     Final   Value: RARE WBC PRESENT, PREDOMINANTLY MONONUCLEAR     NO ORGANISMS SEEN     Performed at [facility]   Culture     Final   Value: NO GROWTH 1 DAY     Performed at [facility]   Report Status PENDING  Incomplete  TISSUE CULTURE     Status: None   Collection Time    11/04/13 11:42 AM      Result Value Ref Range Status   Specimen Description TISSUE LUNG RIGHT   Final   Special Requests RIGHT EMPYEMA PT ON ZINACEF NO 4   Final   Gram Stain     Final   Value: NO WBC SEEN     NO ORGANISMS SEEN     [facility]   Culture PENDING   Incomplete   Report Status PENDING   Incomplete  ANAEROBIC CULTURE     Status: None   Collection Time    11/04/13 11:57 AM      Result Value Ref Range Status   Specimen Description TISSUE LUNG RIGHT   Final   Special Requests RIGHT EMPYEMA PT ON ZINACEF   Final   Gram Stain     Final   Value: MODERATE WBC PRESENT,BOTH PMN AND MONONUCLEAR     ABUNDANT GRAM NEGATIVE RODS     RARE GRAM POSITIVE COCCI     IN PAIRS IN CLUSTERS     Performed at [facility]   Culture PENDING   Incomplete   Report Status PENDING   Incomplete  TISSUE CULTURE     Status: None   Collection Time    11/04/13 12:01 PM      Result Value Ref Range Status   Specimen Description TISSUE LUNG RIGHT   Final   Special Requests RIGHT EMPYEMA PT ON ZINACEF   Final   Gram Stain     Final   Value: MODERATE WBC PRESENT,BOTH PMN AND  MONONUCLEAR     ABUNDANT GRAM NEGATIVE RODS     RARE GRAM POSITIVE COCCI     IN PAIRS IN CLUSTERS     [facility]formed at [facility]   Culture     Final   Value: NO GROWTH 1 DAY     [facility]formed at [facility]   Report Status PENDING   Incomplete    Studies/Results: Dg Chest Port 1 View  11/05/2013   CLINICAL DATA:  Pulmonary embolus. Empyema. Pulmonary infarct. Prior VATS and right thoracotomy on 11/04/2013.  EXAM: PORTABLE CHEST - 1 VIEW  COMPARISON:  11/04/2013  FINDINGS: Considerable pneumoperitoneum beneath the hemidiaphragms. 2 right-sided chest tubes are in place. Right IJ line tip: Cavoatrial junction.  Right heart border is partially obscured by airspace opacity in the right middle lobe and right lower lobe. Upper normal heart size. No visible pneumothorax.  IMPRESSION: 1. Pneumoperitoneum. I have reviewed Dr. Dennie MaizesGerhardt's operative node, and this pneumoperitoneum is an expected postoperative finding. 2. Airspace opacity in the right middle lobe and right lower lobe persist. 3. The previous tiny right apical pneumothorax is no longer well appreciated.   Electronically Signed   By: Herbie BaltimoreWalt  Liebkemann M.D.   On: 11/05/2013 08:12   Dg Chest Port 1 View  11/04/2013   CLINICAL DATA:  28 year old female status post right central line and chest tube placement. Current history of pulmonary embolus, right lower lobe pulmonary infarct. Initial encounter.  EXAM: PORTABLE CHEST - 1 VIEW  COMPARISON:  Chest radiographs 11/03/2013 and earlier.  FINDINGS: Portable AP semi upright view at 1342 hrs.  Right IJ central line placed, tip at the cavoatrial junction level.  Two right side chest tubes in place, both terminating near the mid lung. Subtle pleural edge visible in the apex. Crescentic lucency at the level of the right  hemidiaphragm is probably an inferior pleural edge with adjacent consolidated, infarcted right lower lobe. Previously-seen right subpulmonic effusion probably has regressed.   Mildly improved right lung base ventilation. The left lung remains grossly clear. Stable cardiac size and mediastinal contours.  IMPRESSION: 1. Right IJ central line placed, tip at the cavoatrial junction. 2. 2 right chest tubes placed with decreased right pleural effusion and small right pneumothorax. 3. Right lower lobe pulmonary infarct/consolidation.   Electronically Signed   By: Augusto GambleLee  Hall M.D.   On: 11/04/2013 14:17   Dg Or Local Abdomen  11/04/2013   CLINICAL DATA:  Incorrect instrument count.  EXAM: OR LOCAL ABDOMEN  COMPARISON:  Scout image for CT scan dated 10/18/2013  FINDINGS: No visible retained instruments. Large pneumoperitoneum. IVC filter in place. Two right chest tubes in place. Central line in good position. Endotracheal tube in good position. Second endotracheal tube in the left mainstem bronchus. Slight atelectasis at the right lung base.  IMPRESSION: No retained instruments.  Large pneumoperitoneum.   Electronically Signed   By: Geanie CooleyJim  Maxwell M.D.   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  Gram stain with GNR and culture pending.  Continue current antibiotics awaiting culture.    [name] RighterOMER, [name] Fennimore, MD [email or web address] [phone] pager   [phone] cell 11/05/2013, 9:53 AM

## 2013-11-05 NOTE — Progress Notes (Addendum)
      [address]       [place] [id number]             [phone]      1 Day Post-Op Procedure(s) (LRB): VIDEO BRONCHOSCOPY (N/A) RIGHT VIDEO ASSISTED THORACOSCOPY (VATS)/ RIGHT THOROCOTOMY (Right)  Subjective:  Ms. Ane PaymentHooker complains of pain this morning.  Poor effort to cough.  Unwilling to move or lean forward for physical exam.  Objective: Vital signs in last 24 hours: Temp:  [96.7 F (35.9 C)-102.8 F (39.3 C)] 99.2 F (37.3 C) (10/14 0746) Pulse Rate:  [58-109] 93 (10/14 0800) Cardiac Rhythm:  [-] Normal sinus rhythm (10/14 0800) Resp:  [12-31] 28 (10/14 0800) BP: (101-124)/(56-75) 111/75 mmHg (10/14 0800) SpO2:  [94 %-100 %] 96 % (10/14 0800) Arterial Line BP: (82-166)/(50-68) 114/65 mmHg (10/14 0800) FiO2 (%):  [21 %] 21 % (10/14 0739) Weight:  [112 lb 7 oz (51 kg)] 112 lb 7 oz (51 kg) (10/14 0600)  Intake/Output from previous day: 10/13 0701 - 10/14 0700 In: 3182 [I.V.:2632; IV Piggyback:200] Out: 2065 [Urine:1575; Blood:300; Chest Tube:190] Intake/Output this shift: Total I/O In: 75 [I.V.:75] Out: 140 [Urine:100; Chest Tube:40]  General appearance: alert, cooperative and mild distress Heart: regular rate and rhythm Lungs: clear to auscultation bilaterally Abdomen: soft, non-tender; bowel sounds normal; no masses,  no organomegaly Wound: clean and dry  Lab Results:  Recent Labs  11/04/13 1515 11/05/13 0400  WBC 11.3* 8.7  HGB 9.0* 8.5*  HCT 27.1* 25.3*  PLT 448* 442*   BMET:  Recent Labs  11/03/13 1832 11/05/13 0400  NA 138 139  K 3.7 3.9  CL 98 102  CO2 26 23  GLUCOSE 128* 115*  BUN 9 5*  CREATININE 0.51 0.54  CALCIUM 9.0 8.5    PT/INR:  Recent Labs  11/03/13 1832  LABPROT 17.1*  INR 1.39   ABG    Component Value Date/Time   PHART 7.471* 11/05/2013 0405   HCO3 26.1* 11/05/2013 0405   TCO2 27 11/05/2013 0405   O2SAT 95.0 11/05/2013 0405   CBG (last 3)  No results found for this basename: GLUCAP,  in the  last 72 hours  Assessment/Plan: S/P Procedure(s) (LRB): VIDEO BRONCHOSCOPY (N/A) RIGHT VIDEO ASSISTED THORACOSCOPY (VATS)/ RIGHT THOROCOTOMY (Right)  1. Chest tube- no air leak appreciated, poor cough effort by patient, CXR with no pneumothorax 190 cc output yesterday after surgery- leave on suction today 2. Pulm- wean oxygen as tolerated, poor use of IS, patient will require a lot of encouragement for active participation 3. Renal- creatinine, lytes okay 4. Expected post operative blood loss anemia- Hgb at 8.5 will follow 5. GI- Pneumoperitineum, expected, will follow on CXR 6. D/C Arterial LIne 7. Dispo- patient with complaints of pain, advance diet as tolerated, continue current care   LOS: 6 days    BARRETT, ERIN 11/05/2013  Still risk of bleeding with full anticoagulation, will start lower dose lovenox for now I have seen and examined Thanvi Hooker and agree with the above assessment  and plan.  Delight OvensEdward B Arleatha Philipps MD Beeper [phone] Office [phone] 11/05/2013 11:40 AM
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
## 2013-11-05 NOTE — Progress Notes (Signed)
POD # 1 Right Decortication/ drainage of lung abscess  She is just back from a walk  C/o pain   Refused lovenox saying it made her feel weak  BP 104/56  Pulse 67  Temp(Src) 98.7 F (37.1 C) (Oral)  Resp 16  Ht 5\' 7"  (1.702 m)  Wt 112 lb 7 oz (51 kg)  BMI 17.61 kg/m2  SpO2 98%   Intake/Output Summary (Last 24 hours) at 11/05/13 1820 Last data filed at 11/05/13 1400  Gross per 24 hour  Intake 1949.5 ml  Output   2000 ml  Net  -50.5 ml    I explained the importance of lovenox for blood thinning until Dr. Tyrone SageGerhardt feels it is safe to restart the Xarelto. I explained that her weakness was not due to lovenox  She agrees to take the lovenox shot

## 2013-11-06 ENCOUNTER — Inpatient Hospital Stay (HOSPITAL_COMMUNITY): Payer: No Typology Code available for payment source

## 2013-11-06 LAB — COMPREHENSIVE METABOLIC PANEL
ALT: 6 U/L (ref 0–35)
ANION GAP: 11 (ref 5–15)
AST: 12 U/L (ref 0–37)
Albumin: 2 g/dL — ABNORMAL LOW (ref 3.5–5.2)
Alkaline Phosphatase: 76 U/L (ref 39–117)
BUN: 6 mg/dL (ref 6–23)
CALCIUM: 8.8 mg/dL (ref 8.4–10.5)
CO2: 26 meq/L (ref 19–32)
CREATININE: 0.48 mg/dL — AB (ref 0.50–1.10)
Chloride: 105 mEq/L (ref 96–112)
GFR calc Af Amer: >90 mL/min (ref >90)
GLUCOSE: 111 mg/dL — AB (ref 70–99)
Potassium: 3.9 mEq/L (ref 3.7–5.3)
Sodium: 142 mEq/L (ref 137–147)
TOTAL PROTEIN: 7.3 g/dL (ref 6.0–8.3)
Total Bilirubin: <0.2 mg/dL — ABNORMAL LOW (ref 0.3–1.2)

## 2013-11-06 LAB — CBC
HEMATOCRIT: 24.4 % — AB (ref 36.0–46.0)
HEMOGLOBIN: 8 g/dL — AB (ref 12.0–15.0)
MCH: 27.8 pg (ref 26.0–34.0)
MCHC: 32.8 g/dL (ref 30.0–36.0)
MCV: 84.7 fL (ref 78.0–100.0)
Platelets: 465 10*3/uL — ABNORMAL HIGH (ref 150–400)
RBC: 2.88 MIL/uL — AB (ref 3.87–5.11)
RDW: 14.6 % (ref 11.5–15.5)
WBC: 8.5 10*3/uL (ref 4.0–10.5)

## 2013-11-06 LAB — BLOOD PRODUCT ORDER (VERBAL) VERIFICATION

## 2013-11-06 MED ORDER — ENOXAPARIN SODIUM 30 MG/0.3ML ~~LOC~~ SOLN
30.0000 mg | Freq: Two times a day (BID) | SUBCUTANEOUS | Status: DC
Start: 2013-11-06 — End: 2013-11-09
  Administered 2013-11-06 – 2013-11-08 (×6): 30 mg via SUBCUTANEOUS
  Filled 2013-11-06 (×8): qty 0.3

## 2013-11-06 NOTE — Progress Notes (Signed)
[facility]  Date of Admission:  10/30/2013  Antibiotics: Vancomycin and zosyn  Subjective: Better today  Objective: Temp:  [98.2 F (36.8 C)-99.8 F (37.7 C)] 98.4 F (36.9 C) (10/15 1205) Pulse Rate:  [53-99] 67 (10/15 1327) Resp:  [12-36] 22 (10/15 1327) BP: (98-118)/(54-71) 106/64 mmHg (10/15 1327) SpO2:  [91 %-99 %] 97 % (10/15 1327) FiO2 (%):  [21 %] 21 % (10/14 1656)  General: awake, alert Skin: no rashes Lungs: Ant CTA Cor: RRR   Lab Results Lab Results  Component Value Date   WBC 8.5 11/06/2013   HGB 8.0* 11/06/2013   HCT 24.4* 11/06/2013   MCV 84.7 11/06/2013   PLT 465* 11/06/2013    Lab Results  Component Value Date   CREATININE 0.48* 11/06/2013   BUN 6 11/06/2013   NA 142 11/06/2013   K 3.9 11/06/2013   CL 105 11/06/2013   CO2 26 11/06/2013    Lab Results  Component Value Date   ALT 6 11/06/2013   AST 12 11/06/2013   ALKPHOS 76 11/06/2013   BILITOT <0.2* 11/06/2013      Microbiology: Recent Results (from the past 240 hour(s))  MRSA PCR SCREENING     Status: None   Collection Time    10/30/13  2:26 PM      Result Value Ref Range Status   MRSA by PCR NEGATIVE  NEGATIVE Final   Comment:            The GeneXpert MRSA Assay (FDA     approved for NASAL specimens     only), is one component of a     comprehensive MRSA colonization     surveillance program. It is not     intended to diagnose MRSA     infection nor to guide or     monitor treatment for     MRSA infections.  CULTURE, RESPIRATORY (NON-EXPECTORATED)     Status: None   Collection Time    11/04/13 10:38 AM      Result Value Ref Range Status   Specimen Description BRONCHIAL WASHINGS   Final   Special Requests PT ON ZINACEF NO 1   Final   Gram Stain     Final   Value: FEW WBC PRESENT,BOTH PMN AND MONONUCLEAR     NO SQUAMOUS EPITHELIAL CELLS SEEN     NO ORGANISMS SEEN     Performed at [facility]   Culture     Final   Value: MODERATE  STAPHYLOCOCCUS AUREUS     Note: RIFAMPIN AND GENTAMICIN SHOULD NOT BE USED AS SINGLE DRUGS FOR TREATMENT OF STAPH INFECTIONS.     Performed at [facility]   Report Status PENDING   Incomplete  BODY FLUID CULTURE     Status: None   Collection Time    11/04/13 11:38 AM      Result Value Ref Range Status   Specimen Description FLUID PLEURAL RIGHT   Final   Special Requests PT ON ZINACEF NO 2   Final   Gram Stain     Final   Value: RARE WBC PRESENT, PREDOMINANTLY MONONUCLEAR     NO ORGANISMS SEEN     Performed at [facility]   Culture     Final   Value: NO GROWTH 2 DAYS     Performed at [facility]   Report Status PENDING   Incomplete  TISSUE CULTURE     Status: None  Collection Time    11/04/13 11:42 AM      Result Value Ref Range Status   Specimen Description TISSUE LUNG RIGHT   Final   Special Requests RIGHT EMPYEMA PT ON ZINACEF NO 4   Final   Gram Stain     Final   Value: NO WBC SEEN     NO ORGANISMS SEEN     Performed at [facility]   Culture     Final   Value: NO GROWTH 1 DAY     Performed at [facility]   Report Status PENDING   Incomplete  ANAEROBIC CULTURE     Status: None   Collection Time    11/04/13 11:57 AM      Result Value Ref Range Status   Specimen Description TISSUE LUNG RIGHT   Final   Special Requests RIGHT EMPYEMA PT ON ZINACEF   Final   Gram Stain     Final   Value: MODERATE WBC PRESENT,BOTH PMN AND MONONUCLEAR     ABUNDANT GRAM NEGATIVE RODS     RARE GRAM POSITIVE COCCI     IN PAIRS IN CLUSTERS     Performed at [facility]   Culture     Final   Value: NO ANAEROBES ISOLATED; CULTURE IN PROGRESS FOR 5 DAYS     Performed at [facility]   Report Status PENDING   Incomplete  TISSUE CULTURE     Status: None   Collection Time    11/04/13 12:01 PM      Result Value Ref Range Status   Specimen Description TISSUE LUNG RIGHT   Final   Special Requests RIGHT EMPYEMA PT ON ZINACEF   Final    Gram Stain     Final   Value: MODERATE WBC PRESENT,BOTH PMN AND MONONUCLEAR     ABUNDANT GRAM NEGATIVE RODS     RARE GRAM POSITIVE COCCI     IN PAIRS IN CLUSTERS     Performed at [facility]   Culture     Final   Value: Culture reincubated for better growth     Performed at [facility]   Report Status PENDING   Incomplete    Studies/Results: Dg Chest Port 1 View  11/06/2013   CLINICAL DATA:  Right base atelectasis.  EXAM: PORTABLE CHEST - 1 VIEW  COMPARISON:  11/05/2013 and 11/04/2013  FINDINGS: 2 right-sided chest tubes and central line remain in place, unchanged. There is a very tiny right apical pneumothorax. Pneumoperitoneum has diminished. Atelectasis and blunting of the right costophrenic angle persist. Left lung is clear. Heart size and vascularity are normal.  IMPRESSION: Tiny right apical pneumothorax. Diminished pneumoperitoneum. Persistent atelectasis and pleural thickening at the right base.   Electronically Signed   By: Geanie Cooley M.D.   On: 11/06/2013 08:17   Dg Chest Port 1 View  11/05/2013   CLINICAL DATA:  Pulmonary embolus. Empyema. Pulmonary infarct. Prior VATS and right thoracotomy on 11/04/2013.  EXAM: PORTABLE CHEST - 1 VIEW  COMPARISON:  11/04/2013  FINDINGS: Considerable pneumoperitoneum beneath the hemidiaphragms. 2 right-sided chest tubes are in place. Right IJ line tip: Cavoatrial junction.  Right heart border is partially obscured by airspace opacity in the right middle lobe and right lower lobe. Upper normal heart size. No visible pneumothorax.  IMPRESSION: 1. Pneumoperitoneum. I have reviewed Dr. Dennie Maizes operative node, and this pneumoperitoneum is an expected postoperative finding. 2. Airspace opacity in the right middle lobe and right  lower lobe persist. 3. The previous tiny right apical pneumothorax is no longer well appreciated.   Electronically Signed   By: Herbie BaltimoreWalt  Liebkemann M.D.   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  Gram stain with GNR and culture pending.  All with GNR, likely anaerobic.  I will d/c vancomycin.  Wait for cultures, consider oral therapy depending on organism sensitivities.     Staci RighterOMER, Devin Ganaway, MD [facility] [email or web address] [phone] pager   [phone] cell 11/06/2013, 2:47 PM

## 2013-11-06 NOTE — Progress Notes (Signed)
Pt tx to 2W24 in wheelchair on monitor without event. Pt's fiancee in accompaniment. Belenda CruiseKristin RN present for pt arrival, bedside report and tele hookup  Felipa EmoryJarrell, Remedios Mckone Denise

## 2013-11-06 NOTE — Progress Notes (Addendum)
      [address]       [name] [id number]             [phone]      2 Days Post-Op Procedure(s) (LRB): VIDEO BRONCHOSCOPY (N/A) RIGHT VIDEO ASSISTED THORACOSCOPY (VATS)/ RIGHT THOROCOTOMY (Right)  Subjective:  More alert, feels better this morning.  Her pain is under much better control.  She is ambulating  Objective: Vital signs in last 24 hours: Temp:  [98.2 F (36.8 C)-99.8 F (37.7 C)] 98.2 F (36.8 C) (10/15 0742) Pulse Rate:  [64-99] 83 (10/15 0700) Cardiac Rhythm:  [-] Normal sinus rhythm (10/15 0600) Resp:  [12-36] 23 (10/15 0753) BP: (104-122)/(54-77) 111/64 mmHg (10/15 0700) SpO2:  [91 %-100 %] 99 % (10/15 0753) Arterial Line BP: (126-133)/(57-62) 133/57 mmHg (10/14 1000) FiO2 (%):  [21 %] 21 % (10/14 1656)  Intake/Output from previous day: 10/14 0701 - 10/15 0700 In: 2197.5 [I.V.:1747.5; IV Piggyback:450] Out: 2150 [Urine:2050; Chest Tube:100]  General appearance: alert, cooperative and no distress Heart: regular rate and rhythm Lungs: diminished breath sounds right base Abdomen: soft, non-tender; bowel sounds normal; no masses,  no organomegaly Wound: clean and dry  Lab Results:  Recent Labs  11/05/13 0400 11/06/13 0430  WBC 8.7 8.5  HGB 8.5* 8.0*  HCT 25.3* 24.4*  PLT 442* 465*   BMET:  Recent Labs  11/05/13 0400 11/06/13 0430  NA 139 142  K 3.9 3.9  CL 102 105  CO2 23 26  GLUCOSE 115* 111*  BUN 5* 6  CREATININE 0.54 0.48*  CALCIUM 8.5 8.8    PT/INR:  Recent Labs  11/03/13 1832  LABPROT 17.1*  INR 1.39   ABG    Component Value Date/Time   PHART 7.471* 11/05/2013 0405   HCO3 26.1* 11/05/2013 0405   TCO2 27 11/05/2013 0405   O2SAT 95.0 11/05/2013 0405   CBG (last 3)  No results found for this basename: GLUCAP,  in the last 72 hours  Assessment/Plan: S/P Procedure(s) (LRB): VIDEO BRONCHOSCOPY (N/A) RIGHT VIDEO ASSISTED THORACOSCOPY (VATS)/ RIGHT THOROCOTOMY (Right)  1. Chest tube- no air leak  appreciated, better effort to cough by patient today, 80 cc output yesterday- CXR shows no pneumothorax or significant effusion, will place chest tubes to water seal 2. Pulm- off oxygen, encouraged use of IS 3. Renal- creatinine, lytes okay 4. Expected Post operative blood loss anemia- Hgb down to 8.0, likely dilutional with IV fluids running, will repeat in AM, patient asymptomatic will avoid transfusion if possible 5. ID- suspected necrotic lung abscess, anaerobe culture negative so far, tissue cultures re-incubated for better growth, GS showing abundant GNR, + fever this morning, no leukocytosis- continue Vanc, Zosyn, ID following 6. GI- pneumoperitoneum stable on CXR 7. Dispo- chest tube to water seal, OR cultures remain pending, continue Vanc, Zosyn- transfer patient to 2W circle bed or 3s   LOS: 7 days    [name], [name] 11/06/2013  Much more comfortable today Foley out today Currently on dvt lovenox and with new caval filter, will increase lovenox to bid  I have seen and examined [name] [name] and agree with the above assessment  and plan.  Delight OvensEdward B Corley Maffeo MD [facility] [phone] Office [phone] 11/06/2013 8:28 AM

## 2013-11-07 ENCOUNTER — Inpatient Hospital Stay (HOSPITAL_COMMUNITY): Payer: No Typology Code available for payment source

## 2013-11-07 LAB — TYPE AND SCREEN
ABO/RH(D): O NEG
Antibody Screen: NEGATIVE
Unit division: 0
Unit division: 0

## 2013-11-07 LAB — CULTURE, RESPIRATORY W GRAM STAIN

## 2013-11-07 LAB — CULTURE, RESPIRATORY

## 2013-11-07 MED ORDER — SODIUM CHLORIDE 0.9 % IV SOLN
3.0000 g | Freq: Three times a day (TID) | INTRAVENOUS | Status: DC
  Administered 2013-11-07 – 2013-11-09 (×6): 3 g via INTRAVENOUS
  Filled 2013-11-07 (×8): qty 3

## 2013-11-07 MED ORDER — PANTOPRAZOLE SODIUM 40 MG PO TBEC
40.0000 mg | DELAYED_RELEASE_TABLET | Freq: Every day | ORAL | Status: DC
  Administered 2013-11-07 – 2013-11-08 (×2): 40 mg via ORAL
  Filled 2013-11-07: qty 1
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
## 2013-11-07 NOTE — Progress Notes (Signed)
[facility]  Date of Admission:  10/30/2013  Antibiotics: zosyn  Subjective: Wants both tubes out; not eating  Objective: Temp:  [98.4 F (36.9 C)-98.7 F (37.1 C)] 98.5 F (36.9 C) (10/16 0510) Pulse Rate:  [53-74] 72 (10/16 0510) Resp:  [17-24] 20 (10/16 0808) BP: (98-107)/(53-64) 107/57 mmHg (10/16 0510) SpO2:  [91 %-98 %] 91 % (10/16 0808)  General: awake, alert, sleepy Skin: no rashes Lungs: Ant CTA Cor: RRR   Lab Results Lab Results  Component Value Date   WBC 8.5 11/06/2013   HGB 8.0* 11/06/2013   HCT 24.4* 11/06/2013   MCV 84.7 11/06/2013   PLT 465* 11/06/2013    Lab Results  Component Value Date   CREATININE 0.48* 11/06/2013   BUN 6 11/06/2013   NA 142 11/06/2013   K 3.9 11/06/2013   CL 105 11/06/2013   CO2 26 11/06/2013    Lab Results  Component Value Date   ALT 6 11/06/2013   AST 12 11/06/2013   ALKPHOS 76 11/06/2013   BILITOT <0.2* 11/06/2013      Microbiology: Recent Results (from the past 240 hour(s))  MRSA PCR SCREENING     Status: None   Collection Time    10/30/13  2:26 PM      Result Value Ref Range Status   MRSA by PCR NEGATIVE  NEGATIVE Final   Comment:            The GeneXpert MRSA Assay (FDA     approved for NASAL specimens     only), is one component of a     comprehensive MRSA colonization     surveillance program. It is not     intended to diagnose MRSA     infection nor to guide or     monitor treatment for     MRSA infections.  CULTURE, RESPIRATORY (NON-EXPECTORATED)     Status: None   Collection Time    11/04/13 10:38 AM      Result Value Ref Range Status   Specimen Description BRONCHIAL WASHINGS   Final   Special Requests PT ON ZINACEF NO 1   Final   Gram Stain     Final   Value: FEW WBC PRESENT,BOTH PMN AND MONONUCLEAR     NO SQUAMOUS EPITHELIAL CELLS SEEN     NO ORGANISMS SEEN     Performed at [facility]   Culture     Final   Value: MODERATE STAPHYLOCOCCUS AUREUS    Note: RIFAMPIN AND GENTAMICIN SHOULD NOT BE USED AS SINGLE DRUGS FOR TREATMENT OF STAPH INFECTIONS.     Performed at [facility]   Report Status 11/07/2013 FINAL   Final   Organism ID, Bacteria STAPHYLOCOCCUS AUREUS   Final  BODY FLUID CULTURE     Status: None   Collection Time    11/04/13 11:38 AM      Result Value Ref Range Status   Specimen Description FLUID PLEURAL RIGHT   Final   Special Requests PT ON ZINACEF NO 2   Final   Gram Stain     Final   Value: RARE WBC PRESENT, PREDOMINANTLY MONONUCLEAR     NO ORGANISMS SEEN     Performed at [facility]   Culture     Final   Value: NO GROWTH 3 DAYS     Performed at [facility]   Report Status PENDING   Incomplete  TISSUE CULTURE  Status: None   Collection Time    11/04/13 11:42 AM      Result Value Ref Range Status   Specimen Description TISSUE LUNG RIGHT   Final   Special Requests RIGHT EMPYEMA PT ON ZINACEF NO 4   Final   Gram Stain     Final   Value: NO WBC SEEN     NO ORGANISMS SEEN     Performed at [facility]   Culture     Final   Value: NO GROWTH 2 DAYS     Performed at [facility]   Report Status PENDING   Incomplete  ANAEROBIC CULTURE     Status: None   Collection Time    11/04/13 11:57 AM      Result Value Ref Range Status   Specimen Description TISSUE LUNG RIGHT   Final   Special Requests RIGHT EMPYEMA PT ON ZINACEF   Final   Gram Stain     Final   Value: MODERATE WBC PRESENT,BOTH PMN AND MONONUCLEAR     ABUNDANT GRAM NEGATIVE RODS     RARE GRAM POSITIVE COCCI     IN PAIRS IN CLUSTERS     Performed at [facility]   Culture     Final   Value: NO ANAEROBES ISOLATED; CULTURE IN PROGRESS FOR 5 DAYS     Performed at [facility]   Report Status PENDING   Incomplete  TISSUE CULTURE     Status: None   Collection Time    11/04/13 12:01 PM      Result Value Ref Range Status   Specimen Description TISSUE LUNG RIGHT   Final   Special Requests RIGHT  EMPYEMA PT ON ZINACEF   Final   Gram Stain     Final   Value: MODERATE WBC PRESENT,BOTH PMN AND MONONUCLEAR     ABUNDANT GRAM NEGATIVE RODS     RARE GRAM POSITIVE COCCI     IN PAIRS IN CLUSTERS     Performed at [facility]   Culture     Final   Value: ABUNDANT STAPHYLOCOCCUS AUREUS     Note: RIFAMPIN AND GENTAMICIN SHOULD NOT BE USED AS SINGLE DRUGS FOR TREATMENT OF STAPH INFECTIONS.     Performed at [facility]   Report Status PENDING   Incomplete    Studies/Results: Dg Chest Port 1 View  11/07/2013   CLINICAL DATA:  Empyema  EXAM: PORTABLE CHEST - 1 VIEW  COMPARISON:  Radiograph 11/06/2013  FINDINGS: Right central venous line unchanged. Chest tube at the right lung base is unchanged. The subpulmonic right pneumothorax which is decreased in volume. There is a second chest tube on the right more superiorly also unchanged. Left lung is clear. Normal mediastinum.  IMPRESSION: 1. Decrease in volume of subpulmonic pneumothorax or right. 2. Two right chest tubes in place.   Electronically Signed   By: Genevive BiStewart  Edmunds M.D.   On: 11/07/2013 08:20   Dg Chest Port 1 View  11/06/2013   CLINICAL DATA:  Right base atelectasis.  EXAM: PORTABLE CHEST - 1 VIEW  COMPARISON:  11/05/2013 and 11/04/2013  FINDINGS: 2 right-sided chest tubes and central line remain in place, unchanged. There is a very tiny right apical pneumothorax. Pneumoperitoneum has diminished. Atelectasis and blunting of the right costophrenic angle persist. Left lung is clear. Heart size and vascularity are normal.  IMPRESSION: Tiny right apical pneumothorax. Diminished pneumoperitoneum. Persistent atelectasis and pleural thickening at the right base.  Electronically Signed   By: Geanie CooleyJim  Maxwell M.D.   On: 11/06/2013 08:17    Assessment/Plan: 1) lung abscess - growing staph aureus in bronchial washing and tissue cultures, MSSA on bronchial washing.  Anaerobic culture negative.   -I will narrow antibitoics to  ampicillin/sulbactam and change to po Augmentin when she is taking better po intake  -will need prolonged course of Augmentin at discharge to be determined by resolution of abscess.    Staci RighterOMER, ROBERT, MD [facility] [email or web address] [phone] pager   [phone] cell 11/07/2013, 11:15 AM

## 2013-11-07 NOTE — Progress Notes (Deleted)
11/07/2013 1100 Additional Medicare IM message given. Isidoro DonningAlesia Hatim Homann RN CCM Case Mgmt phone [phone]
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
## 2013-11-07 NOTE — Progress Notes (Signed)
Patient ambulated with RN and wheelchair for 550 feet without difficulty. Patient returned to room and returned to bed with oxygen and call bell within reach. Bradley FerrisBrandie Sharlee Rufino RN BSN 11/07/2013 7:45 PM

## 2013-11-07 NOTE — Progress Notes (Signed)
Fentanyl PCA syringe changed. 5 ml wasted and witnessed by A. Sloan RN. Bradley FerrisBrandie Almena Hokenson RN BSN 11/07/2013 9:17 AM

## 2013-11-07 NOTE — Progress Notes (Signed)
Anterior chest tube pulled per MD orders and per protocol. Patient tolerated procedure well. RN will continue to monitor. Bradley FerrisBrandie Georgio Hattabaugh RN BSN 11/07/2013

## 2013-11-07 NOTE — Progress Notes (Addendum)
      [address]       [place], [id number]             [phone]       3 Days Post-Op Procedure(s) (LRB): VIDEO BRONCHOSCOPY (N/A) RIGHT VIDEO ASSISTED THORACOSCOPY (VATS)/ RIGHT THOROCOTOMY (Right)  Subjective: Patient with pain. She hopes to get one chest tube out today.  Objective: Vital signs in last 24 hours: Temp:  [98.2 F (36.8 C)-98.7 F (37.1 C)] 98.5 F (36.9 C) (10/16 0510) Pulse Rate:  [53-87] 72 (10/16 0510) Cardiac Rhythm:  [-] Normal sinus rhythm (10/15 2020) Resp:  [17-24] 19 (10/16 0510) BP: (98-117)/(53-66) 107/57 mmHg (10/16 0510) SpO2:  [93 %-99 %] 93 % (10/16 0510)    Intake/Output from previous day: 10/15 0701 - 10/16 0700 In: 359.9 [P.O.:120; I.V.:39.9; IV Piggyback:200] Out: 135 [Urine:115; Chest Tube:20]   Physical Exam:  Cardiovascular: RRR Pulmonary: Diminished right base and left lung is clear. Abdomen: Soft, non tender, bowel sounds present. Extremities: No lower extremity edema. Wounds: Clean and dry.  No erythema or signs of infection.  Lab Results: CBC: Recent Labs  11/05/13 0400 11/06/13 0430  WBC 8.7 8.5  HGB 8.5* 8.0*  HCT 25.3* 24.4*  PLT 442* 465*   BMET:  Recent Labs  11/05/13 0400 11/06/13 0430  NA 139 142  K 3.9 3.9  CL 102 105  CO2 23 26  GLUCOSE 115* 111*  BUN 5* 6  CREATININE 0.54 0.48*  CALCIUM 8.5 8.8    PT/INR: No results found for this basename: LABPROT, INR,  in the last 72 hours ABG:  INR: Will add last result for INR, ABG once components are confirmed Will add last 4 CBG results once components are confirmed  Assessment/Plan:  1. CV - SR 2.  Pulmonary - Chest tube is to water seal. There is no air leak. CXR appears stable this am. As discussed with Dr. Donata ClayVan Trigt, remove Bard. Encourage incentive spirometer and flutter valve. Check CXR in am. 3.ID-Vancomycin stopped yesterday. Zosyn to be continued for lung abcess. Gram stain showed GNR. Await final cultures. 4.  Anemia-Last H and H decreased to 8 and 24.4 5. PE-s/p IVC filter. On lovenox bid 6. Decrease IVF  ZIMMERMAN,DONIELLE MPA-C 11/07/2013,7:39 AM

## 2013-11-08 ENCOUNTER — Inpatient Hospital Stay (HOSPITAL_COMMUNITY): Payer: No Typology Code available for payment source

## 2013-11-08 LAB — BODY FLUID CULTURE: Culture: NO GROWTH

## 2013-11-08 LAB — TISSUE CULTURE
Culture: NO GROWTH
Gram Stain: NONE SEEN

## 2013-11-08 MED ORDER — FE FUMARATE-B12-VIT C-FA-IFC PO CAPS
1.0000 | ORAL_CAPSULE | Freq: Three times a day (TID) | ORAL | Status: DC
  Administered 2013-11-09: 1 via ORAL
  Filled 2013-11-08 (×6): qty 1

## 2013-11-08 MED ORDER — ALPRAZOLAM 0.25 MG PO TABS
0.2500 mg | ORAL_TABLET | Freq: Once | ORAL | Status: AC
  Administered 2013-11-08: 0.25 mg via ORAL

## 2013-11-08 NOTE — Progress Notes (Addendum)
       [address]       [place] [id number]             [phone]          4 Days Post-Op Procedure(s) (LRB): VIDEO BRONCHOSCOPY (N/A) RIGHT VIDEO ASSISTED THORACOSCOPY (VATS)/ RIGHT THOROCOTOMY (Right)  Subjective: Anxious this am about other CT coming out, but does feel more comfortable now that one tube has been d/c'ed. Cough more productive.   Objective: Vital signs in last 24 hours: Patient Vitals for the past 24 hrs:  BP Temp Temp src Pulse Resp SpO2  11/08/13 0745 - - - - 16 98 %  11/08/13 0439 - - - - 17 98 %  11/08/13 0434 105/64 mmHg 97.5 F (36.4 C) Oral 59 22 97 %  11/08/13 0042 - - - - 18 95 %  11/07/13 2012 - - - - 18 -  11/07/13 1958 109/66 mmHg 98.4 F (36.9 C) Oral 73 21 92 %  11/07/13 1500 124/65 mmHg 98 F (36.7 C) Oral 86 18 88 %  11/07/13 0808 - - - - 20 91 %   Current Weight  11/05/13 112 lb 7 oz (51 kg)     Intake/Output from previous day:      PHYSICAL EXAM:  Heart: RRR Lungs: Coarse BS on R Wound: Dressed and dry Chest tube: No air leak    Lab Results: CBC: Recent Labs  11/06/13 0430  WBC 8.5  HGB 8.0*  HCT 24.4*  PLT 465*   BMET:  Recent Labs  11/06/13 0430  NA 142  K 3.9  CL 105  CO2 26  GLUCOSE 111*  BUN 6  CREATININE 0.48*  CALCIUM 8.8    PT/INR: No results found for this basename: LABPROT, INR,  in the last 72 hours  CXR: stable  Assessment/Plan: S/P Procedure(s) (LRB): VIDEO BRONCHOSCOPY (N/A) RIGHT VIDEO ASSISTED THORACOSCOPY (VATS)/ RIGHT THOROCOTOMY (Right) CT output scant with no air leak, CXR stable. Hopefully can consider d/c remaining CT soon. Lung abscess- abundant staph aureus. Continue Unasyn, will switch to Augmentin po probably at discharge. Continue ambulation, pulm toilet, wean O2 as tolerated.   LOS: 9 days    COLLINS,GINA H 11/08/2013  ADDENDUM:  Discussed with MD- will d/c remaining CT today.  Patient wishes to leave hospital ASAP for family death We'll  start on xarelto and Augmentin for discharge and review a.m. chest x-ray patient examined and medical record reviewed,agree with above note. VAN TRIGT III,Marnette Perkins 11/08/2013

## 2013-11-08 NOTE — Progress Notes (Signed)
Removed CT suture on Rt side per MD order per hospital policy. Applied Vaseline/guaze and hyperfix tape to site. Patient tolerated well. Will continue to monitor closely. Lajuana Matteina Daphnee Preiss, RN

## 2013-11-08 NOTE — Progress Notes (Signed)
[facility]  Date of Admission:  10/30/2013  Antibiotics: unasyn  Subjective: Eating a little more  Objective: Temp:  [97.5 F (36.4 C)-98.4 F (36.9 C)] 97.5 F (36.4 C) (10/17 0434) Pulse Rate:  [59-86] 59 (10/17 0434) Resp:  [13-22] 13 (10/17 1013) BP: (105-124)/(64-66) 105/64 mmHg (10/17 0434) SpO2:  [88 %-98 %] 97 % (10/17 1013)  General: awake, alert, more interactive Skin: no rashes Lungs: Ant CTA Cor: RRR   Lab Results Lab Results  Component Value Date   WBC 8.5 11/06/2013   HGB 8.0* 11/06/2013   HCT 24.4* 11/06/2013   MCV 84.7 11/06/2013   PLT 465* 11/06/2013    Lab Results  Component Value Date   CREATININE 0.48* 11/06/2013   BUN 6 11/06/2013   NA 142 11/06/2013   K 3.9 11/06/2013   CL 105 11/06/2013   CO2 26 11/06/2013    Lab Results  Component Value Date   ALT 6 11/06/2013   AST 12 11/06/2013   ALKPHOS 76 11/06/2013   BILITOT <0.2* 11/06/2013      Microbiology: Recent Results (from the past 240 hour(s))  MRSA PCR SCREENING     Status: None   Collection Time    10/30/13  2:26 PM      Result Value Ref Range Status   MRSA by PCR NEGATIVE  NEGATIVE Final   Comment:            The GeneXpert MRSA Assay (FDA     approved for NASAL specimens     only), is one component of a     comprehensive MRSA colonization     surveillance program. It is not     intended to diagnose MRSA     infection nor to guide or     monitor treatment for     MRSA infections.  CULTURE, RESPIRATORY (NON-EXPECTORATED)     Status: None   Collection Time    11/04/13 10:38 AM      Result Value Ref Range Status   Specimen Description BRONCHIAL WASHINGS   Final   Special Requests PT ON ZINACEF NO 1   Final   Gram Stain     Final   Value: FEW WBC PRESENT,BOTH PMN AND MONONUCLEAR     NO SQUAMOUS EPITHELIAL CELLS SEEN     NO ORGANISMS SEEN     Performed at [facility]   Culture     Final   Value: MODERATE STAPHYLOCOCCUS AUREUS    Note: RIFAMPIN AND GENTAMICIN SHOULD NOT BE USED AS SINGLE DRUGS FOR TREATMENT OF STAPH INFECTIONS.     Performed at [facility]   Report Status 11/07/2013 FINAL   Final   Organism ID, Bacteria STAPHYLOCOCCUS AUREUS   Final  BODY FLUID CULTURE     Status: None   Collection Time    11/04/13 11:38 AM      Result Value Ref Range Status   Specimen Description FLUID PLEURAL RIGHT   Final   Special Requests PT ON ZINACEF NO 2   Final   Gram Stain     Final   Value: RARE WBC PRESENT, PREDOMINANTLY MONONUCLEAR     NO ORGANISMS SEEN     Performed at [facility]   Culture     Final   Value: NO GROWTH 3 DAYS     Performed at [facility]   Report Status PENDING   Incomplete  TISSUE CULTURE     Status:  None   Collection Time    11/04/13 11:42 AM      Result Value Ref Range Status   Specimen Description TISSUE LUNG RIGHT   Final   Special Requests RIGHT EMPYEMA PT ON ZINACEF NO 4   Final   Gram Stain     Final   Value: NO WBC SEEN     NO ORGANISMS SEEN     Performed at [facility]   Culture     Final   Value: NO GROWTH 3 DAYS     Performed at [facility]   Report Status 11/08/2013 FINAL   Final  ANAEROBIC CULTURE     Status: None   Collection Time    11/04/13 11:57 AM      Result Value Ref Range Status   Specimen Description TISSUE LUNG RIGHT   Final   Special Requests RIGHT EMPYEMA PT ON ZINACEF   Final   Gram Stain     Final   Value: MODERATE WBC PRESENT,BOTH PMN AND MONONUCLEAR     ABUNDANT GRAM NEGATIVE RODS     RARE GRAM POSITIVE COCCI     IN PAIRS IN CLUSTERS     Performed at [facility]   Culture     Final   Value: NO ANAEROBES ISOLATED; CULTURE IN PROGRESS FOR 5 DAYS     Performed at [facility]   Report Status PENDING   Incomplete  TISSUE CULTURE     Status: None   Collection Time    11/04/13 12:01 PM      Result Value Ref Range Status   Specimen Description TISSUE LUNG RIGHT   Final   Special Requests  RIGHT EMPYEMA PT ON ZINACEF   Final   Gram Stain     Final   Value: MODERATE WBC PRESENT,BOTH PMN AND MONONUCLEAR     ABUNDANT GRAM NEGATIVE RODS     RARE GRAM POSITIVE COCCI     IN PAIRS IN CLUSTERS     Performed at [facility]   Culture     Final   Value: ABUNDANT STAPHYLOCOCCUS AUREUS     Note: RIFAMPIN AND GENTAMICIN SHOULD NOT BE USED AS SINGLE DRUGS FOR TREATMENT OF STAPH INFECTIONS.     Performed at [facility]   Report Status 11/08/2013 FINAL   Final   Organism ID, Bacteria STAPHYLOCOCCUS AUREUS   Final    Studies/Results: Dg Chest Port 1 View  11/08/2013   CLINICAL DATA:  Followup pneumothorax  EXAM: PORTABLE CHEST - 1 VIEW  COMPARISON:  Radiograph 11/07/2013  FINDINGS: Right central venous line unchanged. Chest chest tube at right lung base unchanged. Subpulmonic pneumothorax on the right which is not changed in volume. Small right effusion basilar atelectasis and of airspace disease is also unchanged. Left lung is clear.  IMPRESSION: 1. No interval change. 2. Small sub pulmonic right pneumothorax with chest tube in place.   Electronically Signed   By: Genevive BiStewart  Edmunds M.D.   On: 11/08/2013 08:50   Dg Chest Port 1 View  11/07/2013   CLINICAL DATA:  Empyema  EXAM: PORTABLE CHEST - 1 VIEW  COMPARISON:  Radiograph 11/06/2013  FINDINGS: Right central venous line unchanged. Chest tube at the right lung base is unchanged. The subpulmonic right pneumothorax which is decreased in volume. There is a second chest tube on the right more superiorly also unchanged. Left lung is clear. Normal mediastinum.  IMPRESSION: 1. Decrease in volume of subpulmonic pneumothorax or right.  2. Two right chest tubes in place.   Electronically Signed   By: Genevive Bi M.D.   On: 11/07/2013 08:20    Assessment/Plan: 1) lung abscess - growing staph aureus in bronchial washing and tissue cultures, MSSA on bronchial washing.  Anaerobic culture negative.   -now on amp/sulbactam -will need  prolonged course of Augmentin at discharge to be determined by resolution of abscess, would give 1 month at discharge.  We will arrange follow up in [facility] in about 2-3 weeks. I will sign off, please call with questions   Staci Righter, MD [email or web address] [phone] pager   [phone] cell 11/08/2013, 1:37 PM

## 2013-11-08 NOTE — Progress Notes (Signed)
Removed central line per MD order per hospital policy. Applied pressure dressing and reminded patient that dressing has to remain on for 24 hours. Lajuana Matteina Emersyn Wyss, RN

## 2013-11-09 ENCOUNTER — Inpatient Hospital Stay (HOSPITAL_COMMUNITY): Payer: No Typology Code available for payment source

## 2013-11-09 MED ORDER — FE FUMARATE-B12-VIT C-FA-IFC PO CAPS: 1.0000 | ORAL_CAPSULE | Freq: Three times a day (TID) | ORAL | Status: DC

## 2013-11-09 MED ORDER — OXYCODONE-ACETAMINOPHEN 5-325 MG PO TABS: 1.0000 | ORAL_TABLET | ORAL | Status: DC | PRN

## 2013-11-09 MED ORDER — ALPRAZOLAM 0.25 MG PO TABS: 0.2500 mg | ORAL_TABLET | Freq: Two times a day (BID) | ORAL | Status: DC | PRN

## 2013-11-09 MED ORDER — AMOXICILLIN-POT CLAVULANATE 875-125 MG PO TABS: 1.0000 | ORAL_TABLET | Freq: Two times a day (BID) | ORAL | Status: DC

## 2013-11-09 NOTE — Progress Notes (Signed)
Dr. Bradly ChrisStroud (radiologist) called regarding suspected pneumoperitoneum and recommending a lateral decubitus radiogram. Paged Dr, Morton PetersVan Tright and he states that it is a space from surgery and will follow up with patient out patient. Lajuana Matteina Johneisha Broaden, RN

## 2013-11-09 NOTE — Progress Notes (Signed)
       [address]       [place] [id number]             [phone]          5 Days Post-Op Procedure(s) (LRB): VIDEO BRONCHOSCOPY (N/A) RIGHT VIDEO ASSISTED THORACOSCOPY (VATS)/ RIGHT THOROCOTOMY (Right)  Subjective: Feels much better now that CT is out. Breathing stable.   Objective: Vital signs in last 24 hours: Patient Vitals for the past 24 hrs:  BP Temp Temp src Pulse Resp SpO2  11/08/13 2039 104/61 mmHg 99.4 F (37.4 C) Oral 97 17 97 %  11/08/13 1350 115/62 mmHg 98.5 F (36.9 C) Oral 76 18 98 %  11/08/13 1013 - - - - 13 97 %   Current Weight  11/05/13 112 lb 7 oz (51 kg)     Intake/Output from previous day: 10/17 0701 - 10/18 0700 In: 120 [P.O.:120] Out: -     PHYSICAL EXAM:  Heart: RRR Lungs: Slightly decreased BS in bases, much better inspiratory effort today Wound:Clean and dry    Lab Results: CBC:No results found for this basename: WBC, HGB, HCT, PLT,  in the last 72 hours BMET: No results found for this basename: NA, K, CL, CO2, GLUCOSE, BUN, CREATININE, CALCIUM,  in the last 72 hours  PT/INR: No results found for this basename: LABPROT, INR,  in the last 72 hours  CXR: stable   Assessment/Plan: S/P Procedure(s) (LRB): VIDEO BRONCHOSCOPY (N/A) RIGHT VIDEO ASSISTED THORACOSCOPY (VATS)/ RIGHT THOROCOTOMY (Right) Progressing well, CTs out and CXR stable. Plan d/c home today on po abx per ID recommendations.   LOS: 10 days    COLLINS,GINA H 11/09/2013

## 2013-11-11 LAB — ANAEROBIC CULTURE

## 2013-11-13 ENCOUNTER — Other Ambulatory Visit: Payer: Self-pay | Admitting: Cardiothoracic Surgery

## 2013-11-13 ENCOUNTER — Encounter: Payer: Self-pay | Admitting: Cardiothoracic Surgery

## 2013-11-13 ENCOUNTER — Ambulatory Visit (INDEPENDENT_AMBULATORY_CARE_PROVIDER_SITE_OTHER): Payer: Self-pay | Admitting: Cardiothoracic Surgery

## 2013-11-13 ENCOUNTER — Other Ambulatory Visit: Payer: Self-pay | Admitting: *Deleted

## 2013-11-13 ENCOUNTER — Ambulatory Visit
Admission: RE | Admit: 2013-11-13 | Discharge: 2013-11-13 | Disposition: A | Discharge status: Home / Self Care | Payer: No Typology Code available for payment source | Source: Ambulatory Visit | Attending: Cardiothoracic Surgery | Admitting: Cardiothoracic Surgery

## 2013-11-13 VITALS — BP 108/72 | HR 94 | Resp 16 | Ht 67.0 in | Wt 109.0 lb

## 2013-11-13 DIAGNOSIS — I2699 Other pulmonary embolism without acute cor pulmonale: Secondary | ICD-10-CM

## 2013-11-13 DIAGNOSIS — J90 Pleural effusion, not elsewhere classified: Secondary | ICD-10-CM

## 2013-11-13 DIAGNOSIS — K668 Other specified disorders of peritoneum: Secondary | ICD-10-CM

## 2013-11-13 DIAGNOSIS — R109 Unspecified abdominal pain: Secondary | ICD-10-CM

## 2013-11-13 DIAGNOSIS — J948 Other specified pleural conditions: Secondary | ICD-10-CM

## 2013-11-13 DIAGNOSIS — J869 Pyothorax without fistula: Secondary | ICD-10-CM

## 2013-11-13 MED ORDER — IOHEXOL 300 MG/ML  SOLN
30.0000 mL | Freq: Once | INTRAMUSCULAR | Status: AC | PRN
  Administered 2013-11-13: 30 mL via ORAL

## 2013-11-13 MED ORDER — IOHEXOL 300 MG/ML  SOLN
100.0000 mL | Freq: Once | INTRAMUSCULAR | Status: AC | PRN
  Administered 2013-11-13: 100 mL via INTRAVENOUS

## 2013-11-13 MED ORDER — OXYCODONE-ACETAMINOPHEN 5-325 MG PO TABS: 1.0000 | ORAL_TABLET | ORAL | Status: DC | PRN

## 2013-11-13 MED ORDER — RIVAROXABAN 20 MG PO TABS: 20.0000 mg | ORAL_TABLET | Freq: Every day | ORAL | Status: DC
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
## 2013-11-26 ENCOUNTER — Other Ambulatory Visit: Payer: Self-pay | Admitting: Cardiothoracic Surgery

## 2013-11-26 DIAGNOSIS — J869 Pyothorax without fistula: Secondary | ICD-10-CM

## 2013-11-27 ENCOUNTER — Encounter: Payer: Self-pay | Admitting: Cardiothoracic Surgery

## 2013-11-27 ENCOUNTER — Ambulatory Visit
Admission: RE | Admit: 2013-11-27 | Discharge: 2013-11-27 | Disposition: A | Discharge status: Home / Self Care | Payer: No Typology Code available for payment source | Source: Ambulatory Visit | Attending: Cardiothoracic Surgery | Admitting: Cardiothoracic Surgery

## 2013-11-27 ENCOUNTER — Ambulatory Visit (INDEPENDENT_AMBULATORY_CARE_PROVIDER_SITE_OTHER): Payer: Self-pay | Admitting: Cardiothoracic Surgery

## 2013-11-27 VITALS — BP 95/64 | HR 91 | Ht 67.0 in | Wt 109.0 lb

## 2013-11-27 DIAGNOSIS — J869 Pyothorax without fistula: Secondary | ICD-10-CM

## 2013-11-27 MED ORDER — OXYCODONE-ACETAMINOPHEN 5-325 MG PO TABS: 1.0000 | ORAL_TABLET | Freq: Four times a day (QID) | ORAL | Status: DC | PRN

## 2013-11-27 MED ORDER — FLUCONAZOLE 150 MG PO TABS: 150.0000 mg | ORAL_TABLET | ORAL | Status: AC
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
## 2013-12-01 ENCOUNTER — Other Ambulatory Visit: Payer: Self-pay | Admitting: Cardiothoracic Surgery

## 2013-12-01 DIAGNOSIS — I2699 Other pulmonary embolism without acute cor pulmonale: Secondary | ICD-10-CM

## 2013-12-02 ENCOUNTER — Other Ambulatory Visit: Payer: Self-pay | Admitting: *Deleted

## 2013-12-02 DIAGNOSIS — G8918 Other acute postprocedural pain: Secondary | ICD-10-CM

## 2013-12-02 MED ORDER — OXYCODONE-ACETAMINOPHEN 5-325 MG PO TABS: 1.0000 | ORAL_TABLET | Freq: Four times a day (QID) | ORAL | Status: DC | PRN

## 2013-12-04 ENCOUNTER — Ambulatory Visit
Admission: RE | Admit: 2013-12-04 | Discharge: 2013-12-04 | Disposition: A | Discharge status: Home / Self Care | Payer: No Typology Code available for payment source | Source: Ambulatory Visit | Attending: Cardiothoracic Surgery | Admitting: Cardiothoracic Surgery

## 2013-12-04 ENCOUNTER — Inpatient Hospital Stay: Payer: Self-pay | Admitting: Internal Medicine

## 2013-12-04 ENCOUNTER — Encounter: Payer: Self-pay | Admitting: Cardiothoracic Surgery

## 2013-12-04 ENCOUNTER — Ambulatory Visit (INDEPENDENT_AMBULATORY_CARE_PROVIDER_SITE_OTHER): Payer: Self-pay | Admitting: Cardiothoracic Surgery

## 2013-12-04 VITALS — BP 121/75 | HR 94 | Resp 20 | Ht 67.0 in | Wt 109.0 lb

## 2013-12-04 DIAGNOSIS — I2699 Other pulmonary embolism without acute cor pulmonale: Secondary | ICD-10-CM

## 2013-12-04 DIAGNOSIS — J869 Pyothorax without fistula: Secondary | ICD-10-CM
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
## 2013-12-12 ENCOUNTER — Emergency Department (HOSPITAL_COMMUNITY): Payer: No Typology Code available for payment source

## 2013-12-12 ENCOUNTER — Emergency Department (HOSPITAL_COMMUNITY)
Admission: EM | Admit: 2013-12-12 | Discharge: 2013-12-12 | Discharge status: Against Medical Advice | Payer: No Typology Code available for payment source | Attending: Emergency Medicine | Admitting: Emergency Medicine

## 2013-12-12 ENCOUNTER — Encounter (HOSPITAL_COMMUNITY): Payer: Self-pay

## 2013-12-12 DIAGNOSIS — S299XXA Unspecified injury of thorax, initial encounter: Secondary | ICD-10-CM

## 2013-12-12 DIAGNOSIS — Z8781 Personal history of (healed) traumatic fracture: Secondary | ICD-10-CM | POA: Insufficient documentation

## 2013-12-12 DIAGNOSIS — S29001A Unspecified injury of muscle and tendon of front wall of thorax, initial encounter: Secondary | ICD-10-CM | POA: Insufficient documentation

## 2013-12-12 DIAGNOSIS — Y9389 Activity, other specified: Secondary | ICD-10-CM | POA: Insufficient documentation

## 2013-12-12 DIAGNOSIS — Z720 Tobacco use: Secondary | ICD-10-CM | POA: Insufficient documentation

## 2013-12-12 DIAGNOSIS — W01190A Fall on same level from slipping, tripping and stumbling with subsequent striking against furniture, initial encounter: Secondary | ICD-10-CM | POA: Insufficient documentation

## 2013-12-12 DIAGNOSIS — Y998 Other external cause status: Secondary | ICD-10-CM | POA: Insufficient documentation

## 2013-12-12 DIAGNOSIS — Y9289 Other specified places as the place of occurrence of the external cause: Secondary | ICD-10-CM | POA: Insufficient documentation
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
## 2013-12-12 NOTE — ED Notes (Signed)
Pt called me into room and stated she was leaving.  I informed pt I would let the EDP know she needed to speak with him.  EDP in another room and unavailable at the time.  Pt seen leaving the department by ambulation without difficulty.

## 2013-12-25 ENCOUNTER — Ambulatory Visit: Payer: No Typology Code available for payment source | Admitting: Cardiothoracic Surgery

## 2014-01-01 ENCOUNTER — Other Ambulatory Visit: Payer: Self-pay | Admitting: Cardiothoracic Surgery

## 2014-01-01 ENCOUNTER — Encounter: Payer: Self-pay | Admitting: Cardiothoracic Surgery

## 2014-01-01 ENCOUNTER — Ambulatory Visit
Admission: RE | Admit: 2014-01-01 | Discharge: 2014-01-01 | Disposition: A | Discharge status: Home / Self Care | Payer: No Typology Code available for payment source | Source: Ambulatory Visit | Attending: Cardiothoracic Surgery | Admitting: Cardiothoracic Surgery

## 2014-01-01 ENCOUNTER — Ambulatory Visit (INDEPENDENT_AMBULATORY_CARE_PROVIDER_SITE_OTHER): Payer: Self-pay | Admitting: Cardiothoracic Surgery

## 2014-01-01 ENCOUNTER — Other Ambulatory Visit: Payer: Self-pay | Admitting: *Deleted

## 2014-01-01 VITALS — BP 116/76 | HR 76 | Resp 20 | Ht 67.0 in | Wt 122.0 lb

## 2014-01-01 DIAGNOSIS — J948 Other specified pleural conditions: Secondary | ICD-10-CM

## 2014-01-01 DIAGNOSIS — I2699 Other pulmonary embolism without acute cor pulmonale: Secondary | ICD-10-CM

## 2014-01-01 DIAGNOSIS — J90 Pleural effusion, not elsewhere classified: Secondary | ICD-10-CM

## 2014-01-01 DIAGNOSIS — J869 Pyothorax without fistula: Secondary | ICD-10-CM
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
## 2014-01-08 ENCOUNTER — Telehealth (HOSPITAL_COMMUNITY): Payer: Self-pay

## 2014-01-13 NOTE — Telephone Encounter (Signed)
Left message to call back  

## 2014-01-14 NOTE — Telephone Encounter (Signed)
Left message to call back  

## 2014-01-27 ENCOUNTER — Telehealth (HOSPITAL_COMMUNITY): Payer: Self-pay

## 2014-01-27 NOTE — Telephone Encounter (Signed)
[name] [name] called back about her eye that's still discolored and the glass in her face. She also needs to get her IVC filter removed. Since the glass is right above the eyebrow we would want her to have ENT take a look at that and her eye. She will call back to get [facility]. I contacted IR and they will have someone contact the patient to set up IVC filter removal.

## 2014-01-28 ENCOUNTER — Other Ambulatory Visit: Payer: Self-pay | Admitting: Radiology

## 2014-01-29 ENCOUNTER — Ambulatory Visit (HOSPITAL_COMMUNITY)
Admission: RE | Admit: 2014-01-29 | Discharge: 2014-01-29 | Disposition: A | Discharge status: Home / Self Care | Payer: No Typology Code available for payment source | Source: Ambulatory Visit | Attending: Cardiothoracic Surgery | Admitting: Cardiothoracic Surgery

## 2014-01-29 ENCOUNTER — Encounter (HOSPITAL_COMMUNITY): Payer: Self-pay

## 2014-01-29 DIAGNOSIS — J45909 Unspecified asthma, uncomplicated: Secondary | ICD-10-CM | POA: Insufficient documentation

## 2014-01-29 DIAGNOSIS — I2782 Chronic pulmonary embolism: Secondary | ICD-10-CM | POA: Insufficient documentation

## 2014-01-29 DIAGNOSIS — I2699 Other pulmonary embolism without acute cor pulmonale: Secondary | ICD-10-CM

## 2014-01-29 DIAGNOSIS — F17210 Nicotine dependence, cigarettes, uncomplicated: Secondary | ICD-10-CM | POA: Insufficient documentation

## 2014-01-29 DIAGNOSIS — Z7901 Long term (current) use of anticoagulants: Secondary | ICD-10-CM | POA: Insufficient documentation

## 2014-01-29 LAB — BASIC METABOLIC PANEL
Anion gap: 6 (ref 5–15)
BUN: 8 mg/dL (ref 6–23)
CO2: 25 mmol/L (ref 19–32)
Calcium: 9.4 mg/dL (ref 8.4–10.5)
Chloride: 109 mEq/L (ref 96–112)
Creatinine, Ser: 0.6 mg/dL (ref 0.50–1.10)
GFR calc Af Amer: >90 mL/min (ref >90)
GFR calc non Af Amer: >90 mL/min (ref >90)
GLUCOSE: 97 mg/dL (ref 70–99)
Potassium: 4 mmol/L (ref 3.5–5.1)
SODIUM: 140 mmol/L (ref 135–145)

## 2014-01-29 LAB — CBC WITH DIFFERENTIAL/PLATELET
BASOS PCT: 1 % (ref 0–1)
Basophils Absolute: 0 10*3/uL (ref 0.0–0.1)
EOS ABS: 0.3 10*3/uL (ref 0.0–0.7)
Eosinophils Relative: 5 % (ref 0–5)
HCT: 41.3 % (ref 36.0–46.0)
Hemoglobin: 13.6 g/dL (ref 12.0–15.0)
Lymphocytes Relative: 47 % — ABNORMAL HIGH (ref 12–46)
Lymphs Abs: 2.5 10*3/uL (ref 0.7–4.0)
MCH: 29.6 pg (ref 26.0–34.0)
MCHC: 32.9 g/dL (ref 30.0–36.0)
MCV: 90 fL (ref 78.0–100.0)
MONO ABS: 0.5 10*3/uL (ref 0.1–1.0)
MONOS PCT: 9 % (ref 3–12)
NEUTROS PCT: 38 % — AB (ref 43–77)
Neutro Abs: 2 10*3/uL (ref 1.7–7.7)
Platelets: 192 10*3/uL (ref 150–400)
RBC: 4.59 MIL/uL (ref 3.87–5.11)
RDW: 15.5 % (ref 11.5–15.5)
WBC: 5.3 10*3/uL (ref 4.0–10.5)

## 2014-01-29 LAB — APTT: APTT: 34 s (ref 24–37)

## 2014-01-29 LAB — PROTIME-INR
INR: 1.12 (ref 0.00–1.49)
PROTHROMBIN TIME: 14.6 s (ref 11.6–15.2)

## 2014-01-29 MED ORDER — MIDAZOLAM HCL 2 MG/2ML IJ SOLN
INTRAMUSCULAR | Status: AC | PRN
  Administered 2014-01-29 (×3): 1 mg via INTRAVENOUS

## 2014-01-29 MED ORDER — MIDAZOLAM HCL 2 MG/2ML IJ SOLN
INTRAMUSCULAR | Status: AC
  Filled 2014-01-29: qty 4

## 2014-01-29 MED ORDER — IOHEXOL 300 MG/ML  SOLN
150.0000 mL | Freq: Once | INTRAMUSCULAR | Status: AC | PRN
  Administered 2014-01-29: 60 mL via INTRAVENOUS

## 2014-01-29 MED ORDER — SODIUM CHLORIDE 0.9 % IV SOLN: INTRAVENOUS | Status: DC

## 2014-01-29 MED ORDER — LIDOCAINE HCL 1 % IJ SOLN
INTRAMUSCULAR | Status: DC
Start: 2014-01-29 — End: 2014-01-30
  Filled 2014-01-29: qty 20

## 2014-01-29 MED ORDER — FENTANYL CITRATE 0.05 MG/ML IJ SOLN
INTRAMUSCULAR | Status: AC
  Filled 2014-01-29: qty 4

## 2014-01-29 MED ORDER — FENTANYL CITRATE 0.05 MG/ML IJ SOLN
INTRAMUSCULAR | Status: AC | PRN
  Administered 2014-01-29 (×3): 50 ug via INTRAVENOUS

## 2014-01-29 NOTE — Sedation Documentation (Signed)
Patient is resting comfortably. 

## 2014-01-29 NOTE — Discharge Instructions (Signed)
Wound Care °Wound care helps prevent pain and infection.  °HOME CARE  °· Only take medicine as told by your doctor. °· Clean the wound daily with mild soap and water. °· Change any bandages (dressings) as told by your doctor. °· Put medicated cream and a bandage on the wound as told by your doctor. °· Change the bandage if it gets wet, dirty, or starts to smell. °· Take showers. Do not take baths, swim, or do anything that puts your wound under water. °· Rest and raise (elevate) the wound until the pain and puffiness (swelling) are better. °· Keep all doctor visits as told. °GET HELP RIGHT AWAY IF:  °· Yellowish-white fluid (pus) comes from the wound. °· Medicine does not lessen your pain. °· There is a red streak going away from the wound. °· You have a fever. °MAKE SURE YOU:  °· Understand these instructions. °· Will watch your condition. °· Will get help right away if you are not doing well or get worse. °Document Released: 10/19/2007 Document Revised: 04/03/2011 Document Reviewed: 05/15/2010 °ExitCare® Patient Information ©2015 [employer], LLC. This information is not intended to replace advice given to you by your health care provider. Make sure you discuss any questions you have with your health care provider. ° ° °
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
## 2014-01-29 NOTE — Progress Notes (Signed)
Pt OK for discharge now per Dr. Bonnielee HaffHoss/ Beckey DowningPam Turpin ,PA. Patient instructed to remain upright for 3-4 hours after after discharge and to return if she experiences any complications.

## 2014-01-29 NOTE — Procedures (Signed)
IVC filter removal succesful No complication

## 2014-02-20 ENCOUNTER — Emergency Department (HOSPITAL_COMMUNITY)
Admission: EM | Admit: 2014-02-20 | Discharge: 2014-02-20 | Disposition: A | Discharge status: Home / Self Care | Payer: No Typology Code available for payment source | Attending: Emergency Medicine | Admitting: Emergency Medicine

## 2014-02-20 ENCOUNTER — Encounter (HOSPITAL_COMMUNITY): Payer: Self-pay | Admitting: *Deleted

## 2014-02-20 DIAGNOSIS — Z7901 Long term (current) use of anticoagulants: Secondary | ICD-10-CM | POA: Insufficient documentation

## 2014-02-20 DIAGNOSIS — Z8781 Personal history of (healed) traumatic fracture: Secondary | ICD-10-CM | POA: Insufficient documentation

## 2014-02-20 DIAGNOSIS — Z87828 Personal history of other (healed) physical injury and trauma: Secondary | ICD-10-CM | POA: Insufficient documentation

## 2014-02-20 DIAGNOSIS — Z720 Tobacco use: Secondary | ICD-10-CM | POA: Insufficient documentation

## 2014-02-20 DIAGNOSIS — F329 Major depressive disorder, single episode, unspecified: Secondary | ICD-10-CM | POA: Insufficient documentation

## 2014-02-20 DIAGNOSIS — L02214 Cutaneous abscess of groin: Secondary | ICD-10-CM | POA: Insufficient documentation

## 2014-02-20 DIAGNOSIS — G4700 Insomnia, unspecified: Secondary | ICD-10-CM | POA: Insufficient documentation

## 2014-02-20 DIAGNOSIS — J45901 Unspecified asthma with (acute) exacerbation: Secondary | ICD-10-CM | POA: Insufficient documentation

## 2014-02-20 DIAGNOSIS — Z7952 Long term (current) use of systemic steroids: Secondary | ICD-10-CM | POA: Insufficient documentation

## 2014-02-20 DIAGNOSIS — Z7951 Long term (current) use of inhaled steroids: Secondary | ICD-10-CM | POA: Insufficient documentation

## 2014-02-20 DIAGNOSIS — Z86711 Personal history of pulmonary embolism: Secondary | ICD-10-CM | POA: Insufficient documentation

## 2014-02-20 DIAGNOSIS — Z79899 Other long term (current) drug therapy: Secondary | ICD-10-CM | POA: Insufficient documentation

## 2014-02-20 DIAGNOSIS — F419 Anxiety disorder, unspecified: Secondary | ICD-10-CM | POA: Insufficient documentation

## 2014-02-20 MED ORDER — DOXYCYCLINE HYCLATE 100 MG PO TABS
100.0000 mg | ORAL_TABLET | Freq: Once | ORAL | Status: AC
  Administered 2014-02-20: 100 mg via ORAL
  Filled 2014-02-20: qty 1

## 2014-02-20 MED ORDER — PROMETHAZINE HCL 12.5 MG PO TABS
12.5000 mg | ORAL_TABLET | Freq: Once | ORAL | Status: AC
  Administered 2014-02-20: 12.5 mg via ORAL
  Filled 2014-02-20: qty 1

## 2014-02-20 MED ORDER — LIDOCAINE HCL (PF) 1 % IJ SOLN
INTRAMUSCULAR | Status: AC
  Administered 2014-02-20: 20:00:00
  Filled 2014-02-20: qty 5

## 2014-02-20 MED ORDER — OXYCODONE-ACETAMINOPHEN 5-325 MG PO TABS: 1.0000 | ORAL_TABLET | Freq: Four times a day (QID) | ORAL | Status: DC | PRN

## 2014-02-20 MED ORDER — OXYCODONE-ACETAMINOPHEN 5-325 MG PO TABS
1.0000 | ORAL_TABLET | Freq: Once | ORAL | Status: AC
  Administered 2014-02-20: 1 via ORAL
  Filled 2014-02-20: qty 1

## 2014-02-20 MED ORDER — DOXYCYCLINE HYCLATE 100 MG PO CAPS: 100.0000 mg | ORAL_CAPSULE | Freq: Two times a day (BID) | ORAL | Status: DC

## 2014-02-20 NOTE — Discharge Instructions (Signed)
Please soak in a tub of warm Epsom salt water daily for 15-20 minutes until the abscess resolves. Please use doxycycline 2 times daily until all taken. Please take this medication with food. Use Tylenol  for mild pain, use Percocet for more severe pain. Please see the surgeon listed above if not improving. Abscess An abscess (boil or furuncle) is an infected area on or under the skin. This area is filled with yellowish-white fluid (pus) and other material (debris). HOME CARE   Only take medicines as told by your doctor.  If you were given antibiotic medicine, take it as directed. Finish the medicine even if you start to feel better.  If gauze is used, follow your doctor's directions for changing the gauze.  To avoid spreading the infection:  Keep your abscess covered with a bandage.  Wash your hands well.  Do not share personal care items, towels, or whirlpools with others.  Avoid skin contact with others.  Keep your skin and clothes clean around the abscess.  Keep all doctor visits as told. GET HELP RIGHT AWAY IF:   You have more pain, puffiness (swelling), or redness in the wound site.  You have more fluid or blood coming from the wound site.  You have muscle aches, chills, or you feel sick.  You have a fever. MAKE SURE YOU:   Understand these instructions.  Will watch your condition.  Will get help right away if you are not doing well or get worse. Document Released: 06/28/2007 Document Revised: 07/11/2011 Document Reviewed: 03/24/2011 [facility] Patient Information 2015 [place]. This information is not intended to replace advice given to you by your health care provider. Make sure you discuss any questions you have with your health care provider.

## 2014-02-20 NOTE — ED Notes (Signed)
Swollen red area to rt groin for "couple days", has been using fat back and boil eze.
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
## 2014-02-20 NOTE — ED Notes (Signed)
Abscess to right groin x 3 days.

## 2014-02-24 LAB — CULTURE, ROUTINE-ABSCESS

## 2014-03-10 DIAGNOSIS — Z0271 Encounter for disability determination: Secondary | ICD-10-CM

## 2014-03-11 ENCOUNTER — Other Ambulatory Visit: Payer: Self-pay | Admitting: Cardiothoracic Surgery

## 2014-03-11 DIAGNOSIS — I2699 Other pulmonary embolism without acute cor pulmonale: Secondary | ICD-10-CM

## 2014-03-12 ENCOUNTER — Ambulatory Visit: Payer: No Typology Code available for payment source | Admitting: Cardiothoracic Surgery

## 2014-03-12 ENCOUNTER — Telehealth: Payer: Self-pay | Admitting: *Deleted

## 2014-11-13 ENCOUNTER — Emergency Department (HOSPITAL_COMMUNITY)
Admission: EM | Admit: 2014-11-13 | Discharge: 2014-11-13 | Disposition: A | Discharge status: Home / Self Care | Payer: Self-pay | Attending: Emergency Medicine | Admitting: Emergency Medicine

## 2014-11-13 ENCOUNTER — Encounter (HOSPITAL_COMMUNITY): Payer: Self-pay

## 2014-11-13 ENCOUNTER — Emergency Department (HOSPITAL_COMMUNITY): Payer: Self-pay

## 2014-11-13 DIAGNOSIS — Z3202 Encounter for pregnancy test, result negative: Secondary | ICD-10-CM | POA: Insufficient documentation

## 2014-11-13 DIAGNOSIS — K529 Noninfective gastroenteritis and colitis, unspecified: Secondary | ICD-10-CM | POA: Insufficient documentation

## 2014-11-13 DIAGNOSIS — K625 Hemorrhage of anus and rectum: Secondary | ICD-10-CM

## 2014-11-13 DIAGNOSIS — Z792 Long term (current) use of antibiotics: Secondary | ICD-10-CM | POA: Insufficient documentation

## 2014-11-13 DIAGNOSIS — Z7901 Long term (current) use of anticoagulants: Secondary | ICD-10-CM | POA: Insufficient documentation

## 2014-11-13 DIAGNOSIS — Z79899 Other long term (current) drug therapy: Secondary | ICD-10-CM | POA: Insufficient documentation

## 2014-11-13 DIAGNOSIS — Z87828 Personal history of other (healed) physical injury and trauma: Secondary | ICD-10-CM | POA: Insufficient documentation

## 2014-11-13 DIAGNOSIS — Z86711 Personal history of pulmonary embolism: Secondary | ICD-10-CM | POA: Insufficient documentation

## 2014-11-13 DIAGNOSIS — J45909 Unspecified asthma, uncomplicated: Secondary | ICD-10-CM | POA: Insufficient documentation

## 2014-11-13 DIAGNOSIS — Z8669 Personal history of other diseases of the nervous system and sense organs: Secondary | ICD-10-CM | POA: Insufficient documentation

## 2014-11-13 DIAGNOSIS — Z8659 Personal history of other mental and behavioral disorders: Secondary | ICD-10-CM | POA: Insufficient documentation

## 2014-11-13 DIAGNOSIS — Z720 Tobacco use: Secondary | ICD-10-CM | POA: Insufficient documentation

## 2014-11-13 DIAGNOSIS — Z7951 Long term (current) use of inhaled steroids: Secondary | ICD-10-CM | POA: Insufficient documentation

## 2014-11-13 DIAGNOSIS — Z8781 Personal history of (healed) traumatic fracture: Secondary | ICD-10-CM | POA: Insufficient documentation

## 2014-11-13 LAB — COMPREHENSIVE METABOLIC PANEL
ALK PHOS: 61 U/L (ref 38–126)
ALT: 6 U/L — AB (ref 14–54)
ANION GAP: 8 (ref 5–15)
AST: 15 U/L (ref 15–41)
Albumin: 4.2 g/dL (ref 3.5–5.0)
BILIRUBIN TOTAL: 0.3 mg/dL (ref 0.3–1.2)
BUN: 7 mg/dL (ref 6–20)
CALCIUM: 9.3 mg/dL (ref 8.9–10.3)
CO2: 26 mmol/L (ref 22–32)
Chloride: 107 mmol/L (ref 101–111)
Creatinine, Ser: 0.71 mg/dL (ref 0.44–1.00)
GFR calc Af Amer: >60 mL/min (ref >60)
GFR calc non Af Amer: >60 mL/min (ref >60)
GLUCOSE: 89 mg/dL (ref 65–99)
Potassium: 3.3 mmol/L — ABNORMAL LOW (ref 3.5–5.1)
Sodium: 141 mmol/L (ref 135–145)
TOTAL PROTEIN: 7.7 g/dL (ref 6.5–8.1)

## 2014-11-13 LAB — CBC WITH DIFFERENTIAL/PLATELET
Basophils Absolute: 0 10*3/uL (ref 0.0–0.1)
Basophils Relative: 0 %
EOS ABS: 0.1 10*3/uL (ref 0.0–0.7)
Eosinophils Relative: 2 %
HEMATOCRIT: 38.5 % (ref 36.0–46.0)
HEMOGLOBIN: 13.4 g/dL (ref 12.0–15.0)
LYMPHS ABS: 2.6 10*3/uL (ref 0.7–4.0)
Lymphocytes Relative: 37 %
MCH: 31.2 pg (ref 26.0–34.0)
MCHC: 34.8 g/dL (ref 30.0–36.0)
MCV: 89.7 fL (ref 78.0–100.0)
MONOS PCT: 6 %
Monocytes Absolute: 0.4 10*3/uL (ref 0.1–1.0)
NEUTROS PCT: 55 %
Neutro Abs: 3.9 10*3/uL (ref 1.7–7.7)
Platelets: 189 10*3/uL (ref 150–400)
RBC: 4.29 MIL/uL (ref 3.87–5.11)
RDW: 13 % (ref 11.5–15.5)
WBC: 7.1 10*3/uL (ref 4.0–10.5)

## 2014-11-13 LAB — LIPASE, BLOOD: Lipase: 18 U/L (ref 11–51)

## 2014-11-13 LAB — URINALYSIS, ROUTINE W REFLEX MICROSCOPIC
BILIRUBIN URINE: NEGATIVE
GLUCOSE, UA: NEGATIVE mg/dL
Hgb urine dipstick: NEGATIVE
Ketones, ur: NEGATIVE mg/dL
Leukocytes, UA: NEGATIVE
Nitrite: NEGATIVE
PH: 7 (ref 5.0–8.0)
PROTEIN: NEGATIVE mg/dL
Urobilinogen, UA: 0.2 mg/dL (ref 0.0–1.0)

## 2014-11-13 LAB — POC URINE PREG, ED: Preg Test, Ur: NEGATIVE

## 2014-11-13 LAB — POC OCCULT BLOOD, ED: Fecal Occult Bld: POSITIVE — AB

## 2014-11-13 LAB — I-STAT CG4 LACTIC ACID, ED: Lactic Acid, Venous: 1.3 mmol/L (ref 0.5–2.0)

## 2014-11-13 MED ORDER — HYDROMORPHONE HCL 1 MG/ML IJ SOLN
0.5000 mg | Freq: Once | INTRAMUSCULAR | Status: AC
  Administered 2014-11-13: 0.5 mg via INTRAVENOUS
  Filled 2014-11-13: qty 1

## 2014-11-13 MED ORDER — IOHEXOL 300 MG/ML  SOLN
50.0000 mL | Freq: Once | INTRAMUSCULAR | Status: AC | PRN
  Administered 2014-11-13: 50 mL via ORAL

## 2014-11-13 MED ORDER — CIPROFLOXACIN HCL 500 MG PO TABS: 500.0000 mg | ORAL_TABLET | Freq: Two times a day (BID) | ORAL | Status: AC

## 2014-11-13 MED ORDER — SODIUM CHLORIDE 0.9 % IV SOLN
1000.0000 mL | INTRAVENOUS | Status: DC
  Administered 2014-11-13: 1000 mL via INTRAVENOUS

## 2014-11-13 MED ORDER — METRONIDAZOLE IN NACL 5-0.79 MG/ML-% IV SOLN
500.0000 mg | Freq: Once | INTRAVENOUS | Status: AC
  Administered 2014-11-13: 500 mg via INTRAVENOUS
  Filled 2014-11-13: qty 100

## 2014-11-13 MED ORDER — OXYCODONE-ACETAMINOPHEN 5-325 MG PO TABS: 1.0000 | ORAL_TABLET | Freq: Four times a day (QID) | ORAL | Status: AC | PRN

## 2014-11-13 MED ORDER — IOHEXOL 300 MG/ML  SOLN
100.0000 mL | Freq: Once | INTRAMUSCULAR | Status: AC | PRN
  Administered 2014-11-13: 100 mL via INTRAVENOUS

## 2014-11-13 MED ORDER — HYDROMORPHONE HCL 1 MG/ML IJ SOLN
1.0000 mg | Freq: Once | INTRAMUSCULAR | Status: AC
  Administered 2014-11-13: 1 mg via INTRAVENOUS
  Filled 2014-11-13: qty 1

## 2014-11-13 MED ORDER — SODIUM CHLORIDE 0.9 % IV SOLN
1000.0000 mL | Freq: Once | INTRAVENOUS | Status: AC
  Administered 2014-11-13: 1000 mL via INTRAVENOUS

## 2014-11-13 MED ORDER — METRONIDAZOLE 500 MG PO TABS: 500.0000 mg | ORAL_TABLET | Freq: Three times a day (TID) | ORAL | Status: AC

## 2014-11-13 MED ORDER — ONDANSETRON HCL 4 MG/2ML IJ SOLN
4.0000 mg | Freq: Once | INTRAMUSCULAR | Status: AC
  Administered 2014-11-13: 4 mg via INTRAVENOUS
  Filled 2014-11-13: qty 2

## 2014-11-13 MED ORDER — CIPROFLOXACIN IN D5W 400 MG/200ML IV SOLN
400.0000 mg | Freq: Once | INTRAVENOUS | Status: AC
  Administered 2014-11-13: 400 mg via INTRAVENOUS
  Filled 2014-11-13: qty 200

## 2014-11-13 NOTE — ED Notes (Signed)
Pt states she just finished xarelto 2 weeks ago, has not had primary care for quite some time, states has been having abd pain for 4 days and has noticed blood in her urine and stool.

## 2014-11-13 NOTE — Discharge Instructions (Signed)
Return if symptoms are getting worse.   Colitis Colitis is inflammation of the colon. Colitis may last a short time (acute) or it may last a long time (chronic). CAUSES This condition may be caused by:  Viruses.  Bacteria.  Reactions to medicine.  Certain autoimmune diseases, such as Crohn disease or ulcerative colitis. SYMPTOMS Symptoms of this condition include:  Diarrhea.  Passing bloody or tarry stool.  Pain.  Fever.  Vomiting.  Tiredness (fatigue).  Weight loss.  Bloating.  Sudden increase in abdominal pain.  Having fewer bowel movements than usual. DIAGNOSIS This condition is diagnosed with a stool test or a blood test. You may also have other tests, including X-rays, a CT scan, or a colonoscopy. TREATMENT Treatment may include:  Resting the bowel. This involves not eating or drinking for a period of time.  Fluids that are given through an IV tube.  Medicine for pain and diarrhea.  Antibiotic medicines.  Cortisone medicines.  Surgery. HOME CARE INSTRUCTIONS Eating and Drinking  Follow instructions from your health care provider about eating or drinking restrictions.  Drink enough fluid to keep your urine clear or pale yellow.  Work with a dietitian to determine which foods cause your condition to flare up.  Avoid foods that cause flare-ups.  Eat a well-balanced diet. Medicines  Take over-the-counter and prescription medicines only as told by your health care provider.  If you were prescribed an antibiotic medicine, take it as told by your health care provider. Do not stop taking the antibiotic even if you start to feel better. General Instructions  Keep all follow-up visits as told by your health care provider. This is important. SEEK MEDICAL CARE IF:  Your symptoms do not go away.  You develop new symptoms. SEEK IMMEDIATE MEDICAL CARE IF:  You have a fever that does not go away with treatment.  You develop chills.  You have  extreme weakness, fainting, or dehydration.  You have repeated vomiting.  You develop severe pain in your abdomen.  You pass bloody or tarry stool.   This information is not intended to replace advice given to you by your health care provider. Make sure you discuss any questions you have with your health care provider.   Document Released: 02/17/2004 Document Revised: 09/30/2014 Document Reviewed: 05/04/2014 Elsevier Interactive Patient Education 2016 [employer]   Gastrointestinal Bleeding Gastrointestinal (GI) bleeding means there is bleeding somewhere along the digestive tract, between the mouth and anus. CAUSES  There are many different problems that can cause GI bleeding. Possible causes include:  Esophagitis. This is inflammation, irritation, or swelling of the esophagus.  Hemorrhoids.These are veins that are full of blood (engorged) in the rectum. They cause pain, inflammation, and may bleed.  Anal fissures.These are areas of painful tearing which may bleed. They are often caused by passing hard stool.  Diverticulosis.These are pouches that form on the colon over time, with age, and may bleed significantly.  Diverticulitis.This is inflammation in areas with diverticulosis. It can cause pain, fever, and bloody stools, although bleeding is rare.  Polyps and cancer. Colon cancer often starts out as precancerous polyps.  Gastritis and ulcers.Bleeding from the upper gastrointestinal tract (near the stomach) may travel through the intestines and produce black, sometimes tarry, often bad smelling stools. In certain cases, if the bleeding is fast enough, the stools may not be black, but red. This condition may be life-threatening. SYMPTOMS   Vomiting bright red blood or material that looks like coffee grounds.  Bloody, black,  or tarry stools. DIAGNOSIS  Your caregiver may diagnose your condition by taking your history and performing a physical exam. More tests may be  needed, including:  X-rays and other imaging tests.  Esophagogastroduodenoscopy (EGD). This test uses a flexible, lighted tube to look at your esophagus, stomach, and small intestine.  Colonoscopy. This test uses a flexible, lighted tube to look at your colon. TREATMENT  Treatment depends on the cause of your bleeding.   For bleeding from the esophagus, stomach, small intestine, or colon, the caregiver doing your EGD or colonoscopy may be able to stop the bleeding as part of the procedure.  Inflammation or infection of the colon can be treated with medicines.  Many rectal problems can be treated with creams, suppositories, or warm baths.  Surgery is sometimes needed.  Blood transfusions are sometimes needed if you have lost a lot of blood. If bleeding is slow, you may be allowed to go home. If there is a lot of bleeding, you will need to stay in the hospital for observation. HOME CARE INSTRUCTIONS   Take any medicines exactly as prescribed.  Keep your stools soft by eating foods that are high in fiber. These foods include whole grains, legumes, fruits, and vegetables. Prunes (1 to 3 a day) work well for many people.  Drink enough fluids to keep your urine clear or pale yellow. SEEK IMMEDIATE MEDICAL CARE IF:   Your bleeding increases.  You feel lightheaded, weak, or you faint.  You have severe cramps in your back or abdomen.  You pass large blood clots in your stool.  Your problems are getting worse. MAKE SURE YOU:   Understand these instructions.  Will watch your condition.  Will get help right away if you are not doing well or get worse.   This information is not intended to replace advice given to you by your health care provider. Make sure you discuss any questions you have with your health care provider.   Document Released: 01/07/2000 Document Revised: 12/27/2011 Document Reviewed: 06/29/2014 Elsevier Interactive Patient Education 2016 [employer]  Inc[employer]  Ciprofloxacin tablets What is this medicine? CIPROFLOXACIN (sip roe FLOX a sin) is a quinolone antibiotic. It is used to treat certain kinds of bacterial infections. It will not work for colds, flu, or other viral infections. This medicine may be used for other purposes; ask your health care provider or pharmacist if you have questions. What should I tell my health care provider before I take this medicine? They need to know if you have any of these conditions: -bone problems -cerebral disease -history of low levels of potassium in the blood -joint problems -irregular heartbeat -kidney disease -myasthenia gravis -seizures -tendon problems -tingling of the fingers or toes, or other nerve disorder -an unusual or allergic reaction to ciprofloxacin, other antibiotics or medicines, foods, dyes, or preservatives -pregnant or trying to get pregnant -breast-feeding How should I use this medicine? Take this medicine by mouth with a glass of water. Follow the directions on the prescription label. Take your medicine at regular intervals. Do not take your medicine more often than directed. Take all of your medicine as directed even if you think your are better. Do not skip doses or stop your medicine early. You can take this medicine with food or on an empty stomach. It can be taken with a meal that contains dairy or calcium, but do not take it alone with a dairy product, like milk or yogurt or calcium-fortified juice. A special MedGuide will be  given to you by the pharmacist with each prescription and refill. Be sure to read this information carefully each time. Talk to your pediatrician regarding the use of this medicine in children. Special care may be needed. Overdosage: If you think you have taken too much of this medicine contact a poison control center or emergency room at once. NOTE: This medicine is only for you. Do not share this medicine with others. What if I miss a dose? If you  miss a dose, take it as soon as you can. If it is almost time for your next dose, take only that dose. Do not take double or extra doses. What may interact with this medicine? Do not take this medicine with any of the following medications: -cisapride -droperidol -terfenadine -tizanidine This medicine may also interact with the following medications: -antacids -birth control pills -caffeine -cyclosporin -didanosine (ddI) buffered tablets or powder -medicines for diabetes -medicines for inflammation like ibuprofen, naproxen -methotrexate -multivitamins -omeprazole -phenytoin -probenecid -sucralfate -theophylline -warfarin This list may not describe all possible interactions. Give your health care provider a list of all the medicines, herbs, non-prescription drugs, or dietary supplements you use. Also tell them if you smoke, drink alcohol, or use illegal drugs. Some items may interact with your medicine. What should I watch for while using this medicine? Tell your doctor or health care professional if your symptoms do not improve. Do not treat diarrhea with over the counter products. Contact your doctor if you have diarrhea that lasts more than 2 days or if it is severe and watery. You may get drowsy or dizzy. Do not drive, use machinery, or do anything that needs mental alertness until you know how this medicine affects you. Do not stand or sit up quickly, especially if you are an older patient. This reduces the risk of dizzy or fainting spells. This medicine can make you more sensitive to the sun. Keep out of the sun. If you cannot avoid being in the sun, wear protective clothing and use sunscreen. Do not use sun lamps or tanning beds/booths. Avoid antacids, aluminum, calcium, iron, magnesium, and zinc products for 6 hours before and 2 hours after taking a dose of this medicine. What side effects may I notice from receiving this medicine? Side effects that you should report to your  doctor or health care professional as soon as possible: -allergic reactions like skin rash or hives, swelling of the face, lips, or tongue -anxious -confusion -depressed mood -diarrhea -fast, irregular heartbeat -hallucination, loss of contact with reality -joint, muscle, or tendon pain or swelling -pain, tingling, numbness in the hands or feet -suicidal thoughts or other mood changes -sunburn -unusually weak or tired Side effects that usually do not require medical attention (report to your doctor or health care professional if they continue or are bothersome): -dry mouth -headache -nausea -trouble sleeping This list may not describe all possible side effects. Call your doctor for medical advice about side effects. You may report side effects to FDA at 1-800-FDA-1088. Where should I keep my medicine? Keep out of the reach of children. Store at room temperature below 30 degrees C (86 degrees F). Keep container tightly closed. Throw away any unused medicine after the expiration date. NOTE: This sheet is a summary. It may not cover all possible information. If you have questions about this medicine, talk to your doctor, pharmacist, or health care provider.    2016, Elsevier/Gold Standard. (2014-08-20 12:57:02)  Metronidazole tablets or capsules What is this medicine? METRONIDAZOLE (me  troe NI da zole) is an antiinfective. It is used to treat certain kinds of bacterial and protozoal infections. It will not work for colds, flu, or other viral infections. This medicine may be used for other purposes; ask your health care provider or pharmacist if you have questions. What should I tell my health care provider before I take this medicine? They need to know if you have any of these conditions: -anemia or other blood disorders -disease of the nervous system -fungal or yeast infection -if you drink alcohol containing drinks -liver disease -seizures -an unusual or allergic reaction to  metronidazole, or other medicines, foods, dyes, or preservatives -pregnant or trying to get pregnant -breast-feeding How should I use this medicine? Take this medicine by mouth with a full glass of water. Follow the directions on the prescription label. Take your medicine at regular intervals. Do not take your medicine more often than directed. Take all of your medicine as directed even if you think you are better. Do not skip doses or stop your medicine early. Talk to your pediatrician regarding the use of this medicine in children. Special care may be needed. Overdosage: If you think you have taken too much of this medicine contact a poison control center or emergency room at once. NOTE: This medicine is only for you. Do not share this medicine with others. What if I miss a dose? If you miss a dose, take it as soon as you can. If it is almost time for your next dose, take only that dose. Do not take double or extra doses. What may interact with this medicine? Do not take this medicine with any of the following medications: -alcohol or any product that contains alcohol -amprenavir oral solution -cisapride -disulfiram -dofetilide -dronedarone -paclitaxel injection -pimozide -ritonavir oral solution -sertraline oral solution -sulfamethoxazole-trimethoprim injection -thioridazine -ziprasidone This medicine may also interact with the following medications: -birth control pills -cimetidine -lithium -other medicines that prolong the QT interval (cause an abnormal heart rhythm) -phenobarbital -phenytoin -warfarin This list may not describe all possible interactions. Give your health care provider a list of all the medicines, herbs, non-prescription drugs, or dietary supplements you use. Also tell them if you smoke, drink alcohol, or use illegal drugs. Some items may interact with your medicine. What should I watch for while using this medicine? Tell your doctor or health care  professional if your symptoms do not improve or if they get worse. You may get drowsy or dizzy. Do not drive, use machinery, or do anything that needs mental alertness until you know how this medicine affects you. Do not stand or sit up quickly, especially if you are an older patient. This reduces the risk of dizzy or fainting spells. Avoid alcoholic drinks while you are taking this medicine and for three days afterward. Alcohol may make you feel dizzy, sick, or flushed. If you are being treated for a sexually transmitted disease, avoid sexual contact until you have finished your treatment. Your sexual partner may also need treatment. What side effects may I notice from receiving this medicine? Side effects that you should report to your doctor or health care professional as soon as possible: -allergic reactions like skin rash or hives, swelling of the face, lips, or tongue -confusion, clumsiness -difficulty speaking -discolored or sore mouth -dizziness -fever, infection -numbness, tingling, pain or weakness in the hands or feet -trouble passing urine or change in the amount of urine -redness, blistering, peeling or loosening of the skin, including inside the mouth -  seizures -unusually weak or tired -vaginal irritation, dryness, or discharge Side effects that usually do not require medical attention (report to your doctor or health care professional if they continue or are bothersome): -diarrhea -headache -irritability -metallic taste -nausea -stomach pain or cramps -trouble sleeping This list may not describe all possible side effects. Call your doctor for medical advice about side effects. You may report side effects to FDA at 1-800-FDA-1088. Where should I keep my medicine? Keep out of the reach of children. Store at room temperature below 25 degrees C (77 degrees F). Protect from light. Keep container tightly closed. Throw away any unused medicine after the expiration date. NOTE:  This sheet is a summary. It may not cover all possible information. If you have questions about this medicine, talk to your doctor, pharmacist, or health care provider.    2016, Elsevier/Gold Standard. (2012-08-16 14:08:39)  Acetaminophen; Oxycodone tablets What is this medicine? ACETAMINOPHEN; OXYCODONE (a set a MEE noe fen; ox i KOE done) is a pain reliever. It is used to treat moderate to severe pain. This medicine may be used for other purposes; ask your health care provider or pharmacist if you have questions. What should I tell my health care provider before I take this medicine? They need to know if you have any of these conditions: -brain tumor -Crohn's disease, inflammatory bowel disease, or ulcerative colitis -drug abuse or addiction -head injury -heart or circulation problems -if you often drink alcohol -kidney disease or problems going to the bathroom -liver disease -lung disease, asthma, or breathing problems -an unusual or allergic reaction to acetaminophen, oxycodone, other opioid analgesics, other medicines, foods, dyes, or preservatives -pregnant or trying to get pregnant -breast-feeding How should I use this medicine? Take this medicine by mouth with a full glass of water. Follow the directions on the prescription label. You can take it with or without food. If it upsets your stomach, take it with food. Take your medicine at regular intervals. Do not take it more often than directed. Talk to your pediatrician regarding the use of this medicine in children. Special care may be needed. Patients over 44 years old may have a stronger reaction and need a smaller dose. Overdosage: If you think you have taken too much of this medicine contact a poison control center or emergency room at once. NOTE: This medicine is only for you. Do not share this medicine with others. What if I miss a dose? If you miss a dose, take it as soon as you can. If it is almost time for your next  dose, take only that dose. Do not take double or extra doses. What may interact with this medicine? -alcohol -antihistamines -barbiturates like amobarbital, butalbital, butabarbital, methohexital, pentobarbital, phenobarbital, thiopental, and secobarbital -benztropine -drugs for bladder problems like solifenacin, trospium, oxybutynin, tolterodine, hyoscyamine, and methscopolamine -drugs for breathing problems like ipratropium and tiotropium -drugs for certain stomach or intestine problems like propantheline, homatropine methylbromide, glycopyrrolate, atropine, belladonna, and dicyclomine -general anesthetics like etomidate, ketamine, nitrous oxide, propofol, desflurane, enflurane, halothane, isoflurane, and sevoflurane -medicines for depression, anxiety, or psychotic disturbances -medicines for sleep -muscle relaxants -naltrexone -narcotic medicines (opiates) for pain -phenothiazines like perphenazine, thioridazine, chlorpromazine, mesoridazine, fluphenazine, prochlorperazine, promazine, and trifluoperazine -scopolamine -tramadol -trihexyphenidyl This list may not describe all possible interactions. Give your health care provider a list of all the medicines, herbs, non-prescription drugs, or dietary supplements you use. Also tell them if you smoke, drink alcohol, or use illegal drugs. Some items may interact with your  medicine. What should I watch for while using this medicine? Tell your doctor or health care professional if your pain does not go away, if it gets worse, or if you have new or a different type of pain. You may develop tolerance to the medicine. Tolerance means that you will need a higher dose of the medication for pain relief. Tolerance is normal and is expected if you take this medicine for a long time. Do not suddenly stop taking your medicine because you may develop a severe reaction. Your body becomes used to the medicine. This does NOT mean you are addicted. Addiction is a  behavior related to getting and using a drug for a non-medical reason. If you have pain, you have a medical reason to take pain medicine. Your doctor will tell you how much medicine to take. If your doctor wants you to stop the medicine, the dose will be slowly lowered over time to avoid any side effects. You may get drowsy or dizzy. Do not drive, use machinery, or do anything that needs mental alertness until you know how this medicine affects you. Do not stand or sit up quickly, especially if you are an older patient. This reduces the risk of dizzy or fainting spells. Alcohol may interfere with the effect of this medicine. Avoid alcoholic drinks. There are different types of narcotic medicines (opiates) for pain. If you take more than one type at the same time, you may have more side effects. Give your health care provider a list of all medicines you use. Your doctor will tell you how much medicine to take. Do not take more medicine than directed. Call emergency for help if you have problems breathing. The medicine will cause constipation. Try to have a bowel movement at least every 2 to 3 days. If you do not have a bowel movement for 3 days, call your doctor or health care professional. Do not take Tylenol (acetaminophen) or medicines that have acetaminophen with this medicine. Too much acetaminophen can be very dangerous. Many nonprescription medicines contain acetaminophen. Always read the labels carefully to avoid taking more acetaminophen. What side effects may I notice from receiving this medicine? Side effects that you should report to your doctor or health care professional as soon as possible: -allergic reactions like skin rash, itching or hives, swelling of the face, lips, or tongue -breathing difficulties, wheezing -confusion -light headedness or fainting spells -severe stomach pain -unusually weak or tired -yellowing of the skin or the whites of the eyes Side effects that usually do not  require medical attention (report to your doctor or health care professional if they continue or are bothersome): -dizziness -drowsiness -nausea -vomiting This list may not describe all possible side effects. Call your doctor for medical advice about side effects. You may report side effects to FDA at 1-800-FDA-1088. Where should I keep my medicine? Keep out of the reach of children. This medicine can be abused. Keep your medicine in a safe place to protect it from theft. Do not share this medicine with anyone. Selling or giving away this medicine is dangerous and against the law. This medicine may cause accidental overdose and death if it taken by other adults, children, or pets. Mix any unused medicine with a substance like cat litter or coffee grounds. Then throw the medicine away in a sealed container like a sealed bag or a coffee can with a lid. Do not use the medicine after the expiration date. Store at room temperature between 20 and  25 degrees C (68 and 77 degrees F). NOTE: This sheet is a summary. It may not cover all possible information. If you have questions about this medicine, talk to your doctor, pharmacist, or health care provider.    2016, Elsevier/[employer] Standard. (2013-12-10 15:18:46)
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
## 2015-08-29 IMAGING — CR DG CHEST 1V PORT
1 series · 1 of 1 positions shown · non-contrast
Comparison: Radiograph 11/06/2013

CLINICAL DATA: Empyema

EXAM:
PORTABLE CHEST - 1 VIEW

[AP]
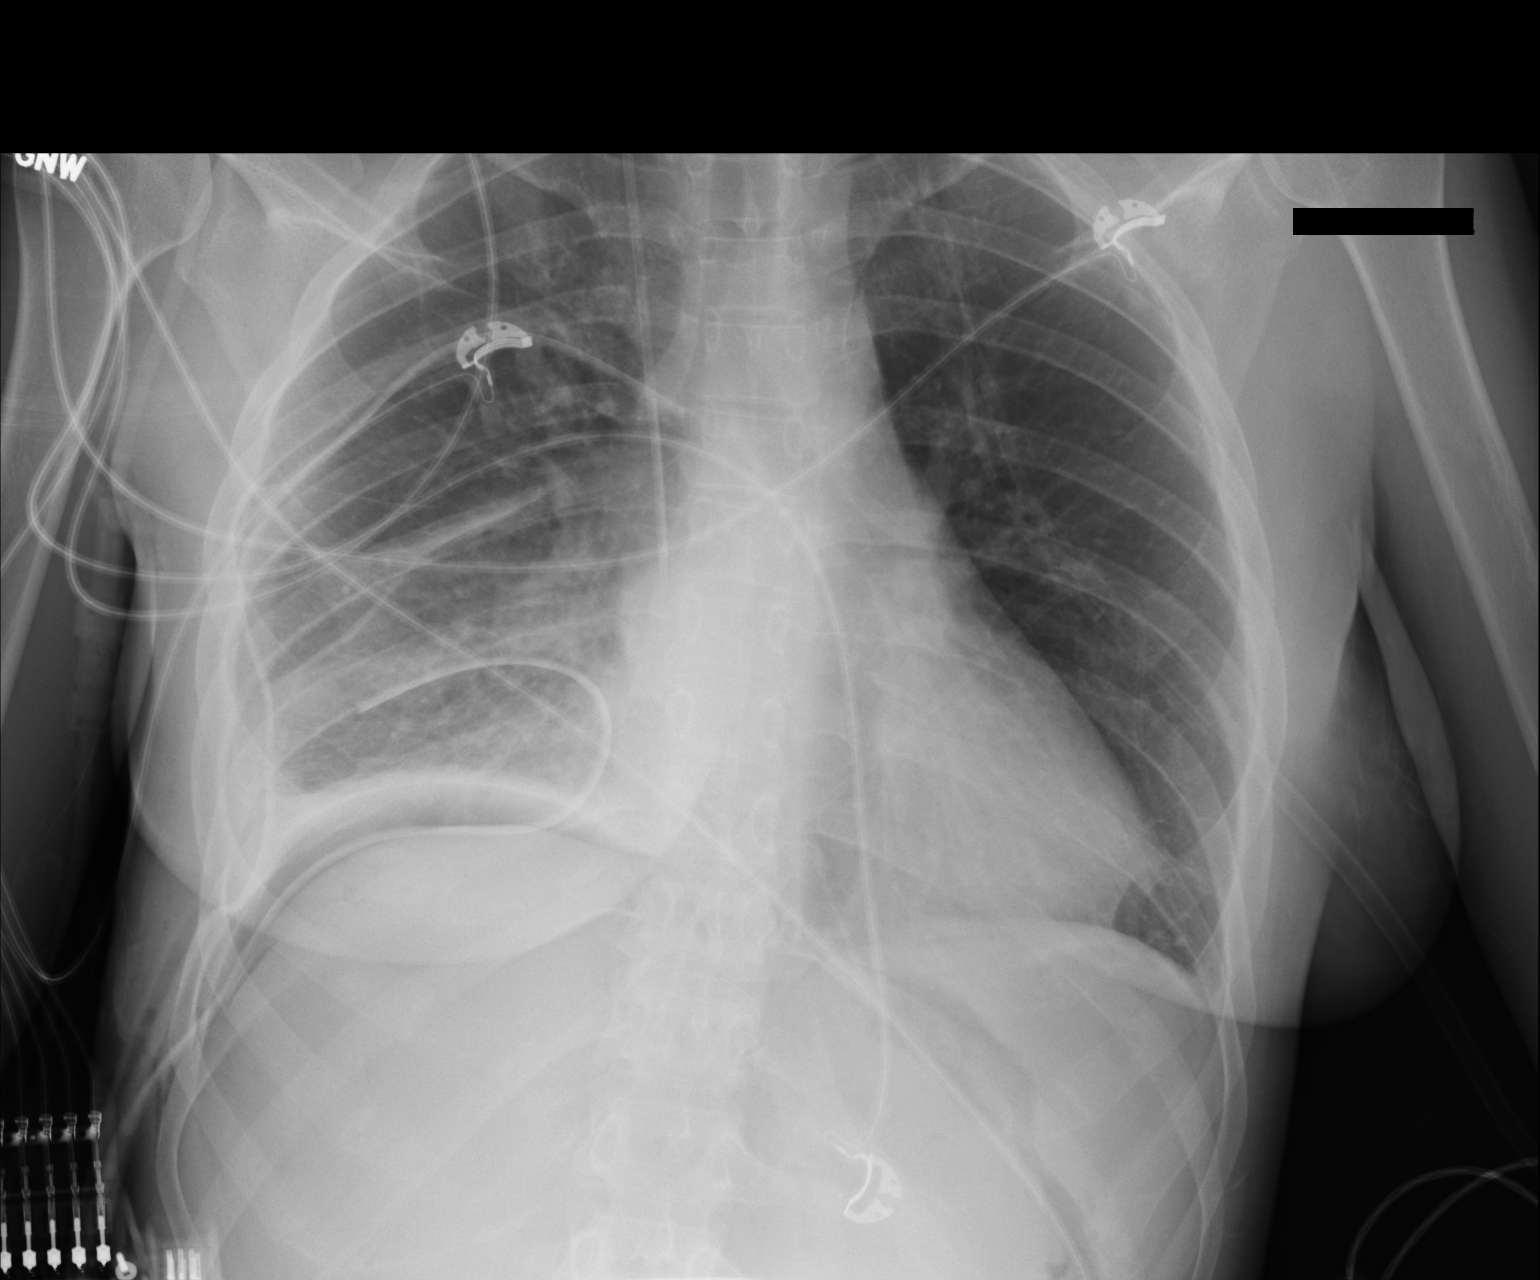

[1 of 1 positions shown; findings below may reference images not displayed]

FINDINGS: Right central venous line unchanged. Chest tube at the right lung
base is unchanged. The subpulmonic right pneumothorax which is
decreased in volume. There is a second chest tube on the right more
superiorly also unchanged. Left lung is clear. Normal mediastinum.
IMPRESSION: 1. Decrease in volume of subpulmonic pneumothorax or right.
2. Two right chest tubes in place.
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
## 2015-10-11 ENCOUNTER — Encounter: Payer: Self-pay | Admitting: Gastroenterology

## 2015-11-04 ENCOUNTER — Ambulatory Visit: Payer: Self-pay | Admitting: Nurse Practitioner

## 2015-11-04 ENCOUNTER — Telehealth: Payer: Self-pay | Admitting: Nurse Practitioner

## 2015-11-04 ENCOUNTER — Encounter: Payer: Self-pay | Admitting: Nurse Practitioner

## 2015-11-04 NOTE — Telephone Encounter (Signed)
PT WAS A NO SHOW AND LETTER SENT  °

## 2015-11-08 NOTE — Telephone Encounter (Signed)
Noted  

## 2015-11-22 ENCOUNTER — Ambulatory Visit: Payer: Self-pay | Admitting: Nurse Practitioner

## 2015-12-01 ENCOUNTER — Telehealth: Payer: Self-pay | Admitting: Nurse Practitioner

## 2015-12-01 ENCOUNTER — Ambulatory Visit: Payer: Self-pay | Admitting: Nurse Practitioner

## 2015-12-01 NOTE — Telephone Encounter (Signed)
[facility] for black tarry stools and weight loss. Pt was a no show on 10/12 and she rescheduled 10/30 and 11/14. She called yesterday and made OV for today and she was a no show. Please advise if I should reschedule again if patient calls back.

## 2015-12-01 NOTE — Telephone Encounter (Signed)
New patient who has cancelled 2 OV and no show for 2 visits. Has yet to be seen. Please advise, thanks!
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
## 2015-12-02 NOTE — Telephone Encounter (Signed)
If patient "no shows" for another office visit we will recommend the GI doctor she has been assigned to, to discharge her from our practice

## 2015-12-07 ENCOUNTER — Ambulatory Visit: Payer: Self-pay | Admitting: Nurse Practitioner

## 2016-03-27 ENCOUNTER — Emergency Department (HOSPITAL_COMMUNITY)
Admission: EM | Admit: 2016-03-27 | Discharge: 2016-03-28 | Disposition: A | Discharge status: Home / Self Care | Payer: Self-pay

## 2016-03-28 NOTE — ED Notes (Signed)
Registration states pt was seen getting in car and car drove off

## 2016-03-28 NOTE — ED Notes (Signed)
Called x 1 no answer

## 2016-10-20 ENCOUNTER — Encounter (HOSPITAL_COMMUNITY): Payer: Self-pay | Admitting: *Deleted

## 2016-10-20 ENCOUNTER — Emergency Department (HOSPITAL_COMMUNITY)
Admission: EM | Admit: 2016-10-20 | Discharge: 2016-10-20 | Disposition: A | Discharge status: Home / Self Care | Payer: Self-pay | Attending: Emergency Medicine | Admitting: Emergency Medicine

## 2016-10-20 DIAGNOSIS — Z1881 Retained glass fragments: Secondary | ICD-10-CM | POA: Insufficient documentation

## 2016-10-20 DIAGNOSIS — M795 Residual foreign body in soft tissue: Secondary | ICD-10-CM | POA: Insufficient documentation

## 2016-10-20 DIAGNOSIS — J45909 Unspecified asthma, uncomplicated: Secondary | ICD-10-CM | POA: Insufficient documentation

## 2016-10-20 DIAGNOSIS — Z7901 Long term (current) use of anticoagulants: Secondary | ICD-10-CM | POA: Insufficient documentation

## 2016-10-20 DIAGNOSIS — Z79899 Other long term (current) drug therapy: Secondary | ICD-10-CM | POA: Insufficient documentation

## 2016-10-20 DIAGNOSIS — F17210 Nicotine dependence, cigarettes, uncomplicated: Secondary | ICD-10-CM | POA: Insufficient documentation

## 2016-10-20 MED ORDER — LIDOCAINE-EPINEPHRINE (PF) 2 %-1:200000 IJ SOLN
10.0000 mL | Freq: Once | INTRAMUSCULAR | Status: AC
  Administered 2016-10-20: 10 mL
  Filled 2016-10-20: qty 20

## 2016-10-20 NOTE — ED Notes (Signed)
Pt states understanding of care given and follow up instructions.  Pt a/o ambulated from ED with a steady gait 
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
## 2016-10-20 NOTE — Discharge Instructions (Signed)
Return for any problems or concerns with your incision site.  The dermabond should fall away naturally.
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
## 2020-09-04 ENCOUNTER — Emergency Department (HOSPITAL_COMMUNITY)
Admission: EM | Admit: 2020-09-04 | Discharge: 2020-09-05 | Disposition: A | Discharge status: Home / Self Care | Payer: Self-pay | Attending: Emergency Medicine | Admitting: Emergency Medicine

## 2020-09-04 ENCOUNTER — Other Ambulatory Visit: Payer: Self-pay

## 2020-09-04 ENCOUNTER — Encounter (HOSPITAL_COMMUNITY): Payer: Self-pay | Admitting: Emergency Medicine

## 2020-09-04 DIAGNOSIS — Z20822 Contact with and (suspected) exposure to covid-19: Secondary | ICD-10-CM | POA: Insufficient documentation

## 2020-09-04 DIAGNOSIS — J45909 Unspecified asthma, uncomplicated: Secondary | ICD-10-CM | POA: Insufficient documentation

## 2020-09-04 DIAGNOSIS — R509 Fever, unspecified: Secondary | ICD-10-CM | POA: Insufficient documentation

## 2020-09-04 DIAGNOSIS — J069 Acute upper respiratory infection, unspecified: Secondary | ICD-10-CM

## 2020-09-04 DIAGNOSIS — F17210 Nicotine dependence, cigarettes, uncomplicated: Secondary | ICD-10-CM | POA: Insufficient documentation

## 2020-09-04 NOTE — ED Triage Notes (Signed)
Pt c/o generalized body aches, headaches, congestion, fever and vomiting since Tuesday. Denies covid exposure.
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
## 2020-09-05 LAB — RESP PANEL BY RT-PCR (FLU A&B, COVID) ARPGX2
Influenza A by PCR: NEGATIVE
Influenza B by PCR: NEGATIVE
SARS Coronavirus 2 by RT PCR: NEGATIVE

## 2020-09-05 NOTE — Discharge Instructions (Addendum)
Drink plenty of fluids and get plenty of rest.  Take over-the-counter medications as needed for relief of symptoms. 

## 2021-06-28 ENCOUNTER — Encounter (HOSPITAL_COMMUNITY): Payer: Self-pay | Admitting: *Deleted

## 2021-06-28 ENCOUNTER — Other Ambulatory Visit: Payer: Self-pay

## 2021-06-28 ENCOUNTER — Emergency Department (HOSPITAL_COMMUNITY)
Admission: EM | Admit: 2021-06-28 | Discharge: 2021-06-28 | Disposition: A | Discharge status: Home / Self Care | Payer: BC Managed Care – PPO | Attending: Emergency Medicine | Admitting: Emergency Medicine

## 2021-06-28 ENCOUNTER — Emergency Department (HOSPITAL_COMMUNITY): Payer: BC Managed Care – PPO

## 2021-06-28 DIAGNOSIS — Z7901 Long term (current) use of anticoagulants: Secondary | ICD-10-CM | POA: Insufficient documentation

## 2021-06-28 DIAGNOSIS — R42 Dizziness and giddiness: Secondary | ICD-10-CM | POA: Insufficient documentation

## 2021-06-28 LAB — CBC WITH DIFFERENTIAL/PLATELET
Abs Immature Granulocytes: 0.02 10*3/uL (ref 0.00–0.07)
Basophils Absolute: 0 10*3/uL (ref 0.0–0.1)
Basophils Relative: 0 %
Eosinophils Absolute: 0 10*3/uL (ref 0.0–0.5)
Eosinophils Relative: 1 %
HCT: 41.4 % (ref 36.0–46.0)
Hemoglobin: 13.9 g/dL (ref 12.0–15.0)
Immature Granulocytes: 0 %
Lymphocytes Relative: 22 %
Lymphs Abs: 1.8 10*3/uL (ref 0.7–4.0)
MCH: 30.4 pg (ref 26.0–34.0)
MCHC: 33.6 g/dL (ref 30.0–36.0)
MCV: 90.6 fL (ref 80.0–100.0)
Monocytes Absolute: 0.6 10*3/uL (ref 0.1–1.0)
Monocytes Relative: 7 %
Neutro Abs: 5.7 10*3/uL (ref 1.7–7.7)
Neutrophils Relative %: 70 %
Platelets: 232 10*3/uL (ref 150–400)
RBC: 4.57 MIL/uL (ref 3.87–5.11)
RDW: 13.2 % (ref 11.5–15.5)
WBC: 8.1 10*3/uL (ref 4.0–10.5)
nRBC: 0 % (ref 0.0–0.2)

## 2021-06-28 LAB — BASIC METABOLIC PANEL
Anion gap: 7 (ref 5–15)
BUN: 11 mg/dL (ref 6–20)
CO2: 23 mmol/L (ref 22–32)
Calcium: 9 mg/dL (ref 8.9–10.3)
Chloride: 106 mmol/L (ref 98–111)
Creatinine, Ser: 0.67 mg/dL (ref 0.44–1.00)
GFR, Estimated: >60 mL/min (ref >60)
Glucose, Bld: 134 mg/dL — ABNORMAL HIGH (ref 70–99)
Potassium: 3.8 mmol/L (ref 3.5–5.1)
Sodium: 136 mmol/L (ref 135–145)

## 2021-06-28 MED ORDER — MECLIZINE HCL 25 MG PO TABS: 25.0000 mg | ORAL_TABLET | Freq: Three times a day (TID) | ORAL | 0 refills | Status: AC | PRN

## 2021-06-28 MED ORDER — MECLIZINE HCL 12.5 MG PO TABS
25.0000 mg | ORAL_TABLET | Freq: Once | ORAL | Status: AC
  Administered 2021-06-28: 25 mg via ORAL
  Filled 2021-06-28: qty 2

## 2021-06-28 NOTE — ED Triage Notes (Signed)
Pt with intermittent dizziness and nausea since Saturday, today dizziness is constant. Hx of dizziness in the past but not like this.

## 2021-06-28 NOTE — ED Notes (Signed)
Pt ambulatory from waiting room to room 19
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
## 2021-06-28 NOTE — Discharge Instructions (Addendum)
Your blood tests were normal.  The CT scan of the brain was normal as well.    Continue taking the medicine as written if you have symptoms.  However if you have worsening symptoms new weakness fevers or pain return back to the ER.

## 2022-03-03 ENCOUNTER — Emergency Department (HOSPITAL_COMMUNITY)
Admission: EM | Admit: 2022-03-03 | Discharge: 2022-03-04 | Disposition: A | Discharge status: Home / Self Care | Payer: BC Managed Care – PPO | Attending: Emergency Medicine | Admitting: Emergency Medicine

## 2022-03-03 ENCOUNTER — Emergency Department (HOSPITAL_COMMUNITY): Payer: BC Managed Care – PPO

## 2022-03-03 ENCOUNTER — Encounter (HOSPITAL_COMMUNITY): Payer: Self-pay | Admitting: *Deleted

## 2022-03-03 ENCOUNTER — Other Ambulatory Visit: Payer: Self-pay

## 2022-03-03 DIAGNOSIS — Z7901 Long term (current) use of anticoagulants: Secondary | ICD-10-CM | POA: Diagnosis not present

## 2022-03-03 DIAGNOSIS — K047 Periapical abscess without sinus: Secondary | ICD-10-CM | POA: Diagnosis present

## 2022-03-03 DIAGNOSIS — J45909 Unspecified asthma, uncomplicated: Secondary | ICD-10-CM | POA: Diagnosis not present

## 2022-03-03 DIAGNOSIS — F17200 Nicotine dependence, unspecified, uncomplicated: Secondary | ICD-10-CM | POA: Diagnosis not present

## 2022-03-03 DIAGNOSIS — D72829 Elevated white blood cell count, unspecified: Secondary | ICD-10-CM | POA: Diagnosis not present

## 2022-03-03 DIAGNOSIS — K1120 Sialoadenitis, unspecified: Secondary | ICD-10-CM | POA: Diagnosis not present

## 2022-03-03 LAB — CBC
HCT: 42.8 % (ref 36.0–46.0)
Hemoglobin: 14 g/dL (ref 12.0–15.0)
MCH: 30.1 pg (ref 26.0–34.0)
MCHC: 32.7 g/dL (ref 30.0–36.0)
MCV: 92 fL (ref 80.0–100.0)
Platelets: 232 10*3/uL (ref 150–400)
RBC: 4.65 MIL/uL (ref 3.87–5.11)
RDW: 13.8 % (ref 11.5–15.5)
WBC: 11.2 10*3/uL — ABNORMAL HIGH (ref 4.0–10.5)
nRBC: 0 % (ref 0.0–0.2)

## 2022-03-03 LAB — BASIC METABOLIC PANEL
Anion gap: 10 (ref 5–15)
BUN: 14 mg/dL (ref 6–20)
CO2: 24 mmol/L (ref 22–32)
Calcium: 9 mg/dL (ref 8.9–10.3)
Chloride: 105 mmol/L (ref 98–111)
Creatinine, Ser: 0.73 mg/dL (ref 0.44–1.00)
GFR, Estimated: >60 mL/min (ref >60)
Glucose, Bld: 94 mg/dL (ref 70–99)
Potassium: 4.3 mmol/L (ref 3.5–5.1)
Sodium: 139 mmol/L (ref 135–145)

## 2022-03-03 LAB — HCG, QUANTITATIVE, PREGNANCY: hCG, Beta Chain, Quant, S: <1 m[IU]/mL (ref <5)

## 2022-03-03 MED ORDER — IBUPROFEN 800 MG PO TABS: 800.0000 mg | ORAL_TABLET | Freq: Three times a day (TID) | ORAL | 0 refills | Status: AC

## 2022-03-03 MED ORDER — FENTANYL CITRATE PF 50 MCG/ML IJ SOSY
50.0000 ug | PREFILLED_SYRINGE | Freq: Once | INTRAMUSCULAR | Status: AC
  Administered 2022-03-03: 50 ug via INTRAVENOUS
  Filled 2022-03-03: qty 1

## 2022-03-03 MED ORDER — CLINDAMYCIN PHOSPHATE 600 MG/50ML IV SOLN
600.0000 mg | Freq: Once | INTRAVENOUS | Status: AC
  Administered 2022-03-03: 600 mg via INTRAVENOUS
  Filled 2022-03-03: qty 50

## 2022-03-03 MED ORDER — IOHEXOL 300 MG/ML  SOLN
80.0000 mL | Freq: Once | INTRAMUSCULAR | Status: AC | PRN
  Administered 2022-03-03: 80 mL via INTRAVENOUS

## 2022-03-03 MED ORDER — CLINDAMYCIN HCL 150 MG PO CAPS: 300.0000 mg | ORAL_CAPSULE | Freq: Four times a day (QID) | ORAL | 0 refills | Status: AC

## 2022-03-03 NOTE — Discharge Instructions (Addendum)
The x-rays show that you have a very large stone in your salivary gland which is likely infected.  I have spoken with the specialist who has recommended that you follow-up in the office, please call the phone [phone], they will need to set you up with an ear nose and throat doctor.  In the meantime please take clindamycin every 6 hours for the next 10 days and ibuprofen 3 times a day.  Hot compresses or warm compresses to help relieve some of the swelling and pain.  Thank you for allowing [place] to treat you in the emergency department today.  After reviewing your examination and potential testing that was done it appears that you are safe to go home.  I would like for you to follow-up with your doctor within the next several days, have them obtain your results and follow-up with them to review all of these tests.  If you should develop severe or worsening symptoms return to the emergency department immediately

## 2022-03-03 NOTE — ED Provider Notes (Signed)
[facility] Provider Note   CSN: [id number] Arrival date & time: 03/03/22  2020     History  Chief Complaint  Patient presents with   Abscess    [name] [name] is a 37 y.o. female.   Abscess  This patient is a 49 old female, she has a history of asthma on inhalers, she is a heavy smoker approximately 2 packs/day and has recurrent dental infections.  About 1 week ago she started to have swelling in the left jaw and under the left tongue.  This is gotten worse, no fevers but has had increasing pain and difficulty chewing food.  The tongue hurts.  There is visible swelling.  Has not seen her dentist.  She relates this back to an initial injury that occurred many years ago when she was chewing on a corn chip and had a piece caught under her tongue.    Home Medications Prior to Admission medications   Medication Sig Start Date End Date Taking? Authorizing Provider  clindamycin (CLEOCIN) 150 MG capsule Take 2 capsules (300 mg total) by mouth 4 (four) times daily for 10 days. May dispense as 164m capsules 03/03/22 03/13/22 Yes MNoemi Chapel MD  ibuprofen (ADVIL) 800 MG tablet Take 1 tablet (800 mg total) by mouth 3 (three) times daily. 03/03/22  Yes MNoemi Chapel MD  albuterol (PROVENTIL HFA;VENTOLIN HFA) 108 (90 BASE) MCG/ACT inhaler Inhale 1-2 puffs into the lungs every 6 (six) hours as needed for wheezing or shortness of breath.    [provider]  apixaban (ELIQUIS) 5 MG TABS tablet Take 5 mg by mouth 2 (two) times daily.    [provider]  ciprofloxacin (CIPRO) 500 MG tablet Take 1 tablet (500 mg total) by mouth 2 (two) times daily. [id number]  GDelora Fuel MD  Fluticasone-Salmeterol (ADVAIR) 500-50 MCG/DOSE AEPB Inhale 1 puff into the lungs 2 (two) times daily as needed (for shortness of breath).     [provider]  meclizine (ANTIVERT) 25 MG tablet Take 1 tablet (25 mg total) by mouth 3 (three) times daily as  needed for up to 10 doses for dizziness. 06/28/21   HLuna Fuse MD  medroxyPROGESTERone (DEPO-PROVERA) 150 MG/ML injection Inject 150 mg into the muscle every 3 (three) months.    [provider]  metroNIDAZOLE (FLAGYL) 500 MG tablet Take 1 tablet (500 mg total) by mouth 3 (three) times daily. [id number]  GDelora Fuel MD  Multiple Vitamin (MULTIVITAMIN WITH MINERALS) TABS tablet Take 1 tablet by mouth daily.    [provider]  oxyCODONE-acetaminophen (PERCOCET/ROXICET) 5-325 MG tablet Take 1 tablet by mouth every 6 (six) hours as needed. [id number]  GDelora Fuel MD      Allergies    Morphine and related and Tramadol hcl    Review of Systems   Review of Systems  All other systems reviewed and are negative.   Physical Exam Updated Vital Signs BP 115/78   Pulse 81   Temp 98.4 F (36.9 C) (Oral)   Resp 18   Ht 1.753 m (5' 9"$ )   Wt 59 kg   SpO2 99%   BMI 19.20 kg/m  Physical Exam Vitals and nursing note reviewed.  Constitutional:      Appearance: She is well-developed. She is not diaphoretic.  HENT:     Head: Normocephalic and atraumatic.     Nose: Nose normal.     Mouth/Throat:     Comments: There is visible  swelling of the left mandibular and submandibular region, there is no woody fullness underneath the tongue but on the left submandibular region there is some.  There is no lymphadenopathy of the neck which is very supple.  She is able to open the mouth which allows me to get approximately 1-1/2 fingers between her teeth.  She is able to push out her tongue with some pain and able to move it from side-to-side.  Phonation is normal.  Tolerating secretions without difficulty. Eyes:     General:        Right eye: No discharge.        Left eye: No discharge.     Conjunctiva/sclera: Conjunctivae normal.  Pulmonary:     Effort: Pulmonary effort is normal. No respiratory distress.  Skin:    General: Skin is warm and dry.     Findings: No erythema or rash.   Neurological:     Mental Status: She is alert.     Coordination: Coordination normal.     ED Results / Procedures / Treatments   Labs (all labs ordered are listed, but only abnormal results are displayed) Labs Reviewed  CBC - Abnormal; Notable for the following components:      Result Value   WBC 11.2 (*)    All other components within normal limits  BASIC METABOLIC PANEL  HCG, QUANTITATIVE, PREGNANCY    EKG None  Radiology CT Soft Tissue Neck W Contrast  Result Date: 03/03/2022 CLINICAL DATA:  Initial evaluation for soft tissue infection. EXAM: CT NECK WITH CONTRAST TECHNIQUE: Multidetector CT imaging of the neck was performed using the standard protocol following the bolus administration of intravenous contrast. RADIATION DOSE REDUCTION: This exam was performed according to the departmental dose-optimization program which includes automated exposure control, adjustment of the mA and/or kV according to patient size and/or use of iterative reconstruction technique. CONTRAST:  36m OMNIPAQUE IOHEXOL 300 MG/ML  SOLN COMPARISON:  None Available. FINDINGS: Pharynx and larynx: Oropharynx and nasopharynx within normal limits. No retropharyngeal collection or swelling. Negative epiglottis. Hypopharynx and supraglottic larynx within normal limits. Glottis symmetric and within normal limits. Small volume layering secretions noted within the subglottic airway extending into the visualize proximal mainstem bronchi. Salivary glands: Parotid glands within normal limits. Right submandibular gland normal. On the left, there is a large obstructive stone within the left Wharton's duct measuring 1.8 x 0.7 cm (series 2, image 45). Proximal ductal dilatation noted. Left submandibular gland is enlarged and hyperenhancing, consistent with acute sialoadenitis. Associated swelling with inflammatory stranding within the adjacent left submandibular space. No discrete abscess or drainable fluid collection. Thyroid: 4  mm right thyroid nodule, of doubtful significance given size and patient age, no follow-up imaging recommended (ref: J Am Coll Radiol. 2015 Feb;12(2): 143-50). Lymph nodes: Asymmetric prominence of left cervical lymph nodes, presumably reactive. Vascular: Normal intravascular enhancement seen throughout the neck. Limited intracranial: Unremarkable. Visualized orbits: Unremarkable. Mastoids and visualized paranasal sinuses: Visualized paranasal sinuses are largely clear. Visualized mastoids and middle ear cavities are well pneumatized and free of fluid. Skeleton: No discrete or worrisome osseous lesions. Upper chest: Mild emphysematous changes noted within the right upper lobe. Superimposed 3 mm nodule present at the medial right upper lobe, partially visualized (series 3, image 121). No other acute abnormality within the visualized upper chest. Other: None. IMPRESSION: 1. 1.8 x 0.7 cm obstructive stone within the left Wharton's duct with associated acute left submandibular sialoadenitis. No discrete abscess or drainable fluid collection. 2. Asymmetric prominence of left  cervical lymph nodes, presumably reactive. 3. Small volume layering secretions within the subglottic airway extending into the visualize proximal mainstem bronchi. Findings suggest this patient is at risk for aspiration. 4. 3 mm right upper lobe nodule, indeterminate. Per Fleischner Society Guidelines, a non-contrast Chest CT at 12 months is optional. If performed and the nodule is stable at 12 months, no further follow-up is recommended. These guidelines do not apply to immunocompromised patients and patients with cancer. Follow up in patients with significant comorbidities as clinically warranted. For lung cancer screening, adhere to Lung-RADS guidelines. Reference: Radiology. 2017; 284(1):228-43. Emphysema (ICD10-J43.9). Electronically Signed   By: Jeannine Boga M.D.   On: 03/03/2022 22:59    Procedures Procedures    Medications  Ordered in ED Medications  fentaNYL (SUBLIMAZE) injection 50 mcg (50 mcg Intravenous Given 03/03/22 2144)  clindamycin (CLEOCIN) IVPB 600 mg (0 mg Intravenous Stopped 03/03/22 2220)  iohexol (OMNIPAQUE) 300 MG/ML solution 80 mL (80 mLs Intravenous Contrast Given 03/03/22 2233)    ED Course/ Medical Decision Making/ A&P                             Medical Decision Making Amount and/or Complexity of Data Reviewed Labs: ordered. Radiology: ordered.  Risk Prescription drug management.   The patient appears uncomfortable, she does appear to have a dental abscess or some type of odontogenic abscess or infection.  This could also be a salivary sialoadenitis or lithiasis.  Will obtain CT scan, labs, pain medicines, antibiotics, she is not febrile nor she tachycardic.  Labs show mild leukocytosis  CT scan was performed, I personally viewed interpret the images, there does appear to be a large stone in the salivary gland duct blocking Wharton's duct, there is likely some infection around this.  No other signs of abscess or drainable fluid collection  Consultations: I discussed the care with Dr. Janace Hoard of the ENT service who recommends outpatient follow-up and agrees with clindamycin  Medication management: Clindamycin, anti-inflammatories, follow-up  ED course: Patient did very well, vital signs are stable, patient agreeable to follow-up in the outpatient setting, school and work note given        Final Clinical Impression(s) / ED Diagnoses Final diagnoses:  Sialoadenitis of submandibular gland    Rx / DC Orders ED Discharge Orders          Ordered    clindamycin (CLEOCIN) 150 MG capsule  4 times daily        03/03/22 2321    ibuprofen (ADVIL) 800 MG tablet  3 times daily        03/03/22 2321              Noemi Chapel, MD 03/03/22 2323

## 2022-03-03 NOTE — ED Triage Notes (Signed)
Pt pain under left tongue x 1 week.  Denies chills.  Pt states she had a piece of a dorito chip got stuck in her tongue 8-10 years ago.

## 2022-10-06 ENCOUNTER — Ambulatory Visit: Payer: BC Managed Care – PPO | Admitting: Internal Medicine
